# Patient Record
Sex: Female | Born: 1970 | Race: Black or African American | Hispanic: No | Marital: Married | Smoking: Never smoker
Health system: Southern US, Community
[De-identification: ages and names within clinical notes are randomized; demographics above are authoritative.]

## PROBLEM LIST (undated history)

## (undated) DIAGNOSIS — E785 Hyperlipidemia, unspecified: Secondary | ICD-10-CM

## (undated) DIAGNOSIS — I10 Essential (primary) hypertension: Secondary | ICD-10-CM

## (undated) DIAGNOSIS — E119 Type 2 diabetes mellitus without complications: Secondary | ICD-10-CM

## (undated) HISTORY — DX: Type 2 diabetes mellitus without complications: E11.9

## (undated) HISTORY — DX: Essential (primary) hypertension: I10

## (undated) HISTORY — DX: Hyperlipidemia, unspecified: E78.5

---

## 2002-08-08 ENCOUNTER — Emergency Department (HOSPITAL_COMMUNITY): Admission: EM | Admit: 2002-08-08 | Discharge: 2002-08-08 | Payer: Self-pay | Admitting: *Deleted

## 2006-03-20 ENCOUNTER — Emergency Department (HOSPITAL_COMMUNITY): Admission: EM | Admit: 2006-03-20 | Discharge: 2006-03-20 | Payer: Self-pay | Admitting: Emergency Medicine

## 2006-10-04 ENCOUNTER — Ambulatory Visit (HOSPITAL_COMMUNITY): Admission: RE | Admit: 2006-10-04 | Discharge: 2006-10-04 | Payer: Self-pay | Admitting: Obstetrics and Gynecology

## 2006-10-27 ENCOUNTER — Inpatient Hospital Stay (HOSPITAL_COMMUNITY): Admission: AD | Admit: 2006-10-27 | Discharge: 2006-10-28 | Payer: Self-pay | Admitting: Obstetrics and Gynecology

## 2006-10-27 ENCOUNTER — Ambulatory Visit: Payer: Self-pay | Admitting: Obstetrics and Gynecology

## 2006-10-28 ENCOUNTER — Ambulatory Visit: Payer: Self-pay | Admitting: Obstetrics & Gynecology

## 2006-10-28 ENCOUNTER — Inpatient Hospital Stay (HOSPITAL_COMMUNITY): Admission: AD | Admit: 2006-10-28 | Discharge: 2006-10-30 | Payer: Self-pay | Admitting: Family Medicine

## 2006-10-28 ENCOUNTER — Encounter: Payer: Self-pay | Admitting: Family Medicine

## 2006-11-05 ENCOUNTER — Inpatient Hospital Stay (HOSPITAL_COMMUNITY): Admission: EM | Admit: 2006-11-05 | Discharge: 2006-11-14 | Payer: Self-pay | Admitting: *Deleted

## 2008-09-08 ENCOUNTER — Ambulatory Visit: Payer: Self-pay | Admitting: Family Medicine

## 2008-09-08 DIAGNOSIS — E119 Type 2 diabetes mellitus without complications: Secondary | ICD-10-CM | POA: Insufficient documentation

## 2008-09-08 DIAGNOSIS — E669 Obesity, unspecified: Secondary | ICD-10-CM

## 2008-09-08 DIAGNOSIS — I10 Essential (primary) hypertension: Secondary | ICD-10-CM | POA: Insufficient documentation

## 2008-09-08 LAB — CONVERTED CEMR LAB: Glucose, Bld: 171 mg/dL

## 2008-09-18 ENCOUNTER — Encounter: Payer: Self-pay | Admitting: Family Medicine

## 2008-09-18 LAB — CONVERTED CEMR LAB
BUN: 9 mg/dL (ref 6–23)
Calcium: 8.9 mg/dL (ref 8.4–10.5)
Creatinine, Ser: 0.64 mg/dL (ref 0.40–1.20)
Glucose, Bld: 103 mg/dL — ABNORMAL HIGH (ref 70–99)
Monocytes Relative: 6 % (ref 3–12)
Neutro Abs: 3.8 10*3/uL (ref 1.7–7.7)
Platelets: 306 10*3/uL (ref 150–400)
RDW: 13.2 % (ref 11.5–15.5)
Sodium: 139 meq/L (ref 135–145)
TSH: 1.792 microintl units/mL (ref 0.350–4.500)
Total CHOL/HDL Ratio: 3.3
WBC: 7.3 10*3/uL (ref 4.0–10.5)

## 2008-12-09 IMAGING — CT CT ABDOMEN W/ CM
1 of 5 series · 10 of 32 positions shown, 16 images · IV contrast (Omnipaque 300)
Comparison: None

ABDOMEN CT WITH CONTRAST

CLINICAL DATA: Nausea and vomiting, status post C-section.
TECHNIQUE: Multidetector CT imaging of the abdomen and pelvis was performed
following the standard protocol during bolus administration of intravenous
contrast.

Contrast:  100 cc Omnipaque 300

[Series 2: abd_pel 5.0 b40f · axial · 0.72mm/px · z∈[-407,-22]mm · 10 of 95 slices shown, 16 images]
[im 9/95  soft-tissue]
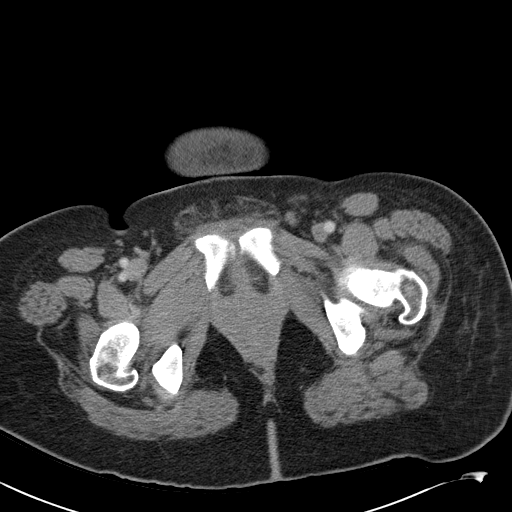
[im 9/95  bone]
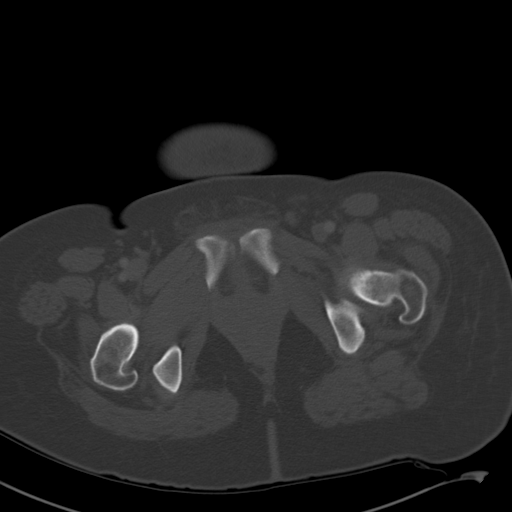
[im 18/95  soft-tissue]
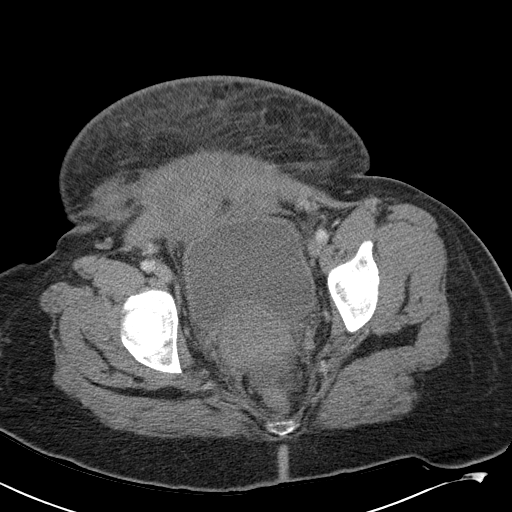
[im 26/95  soft-tissue]
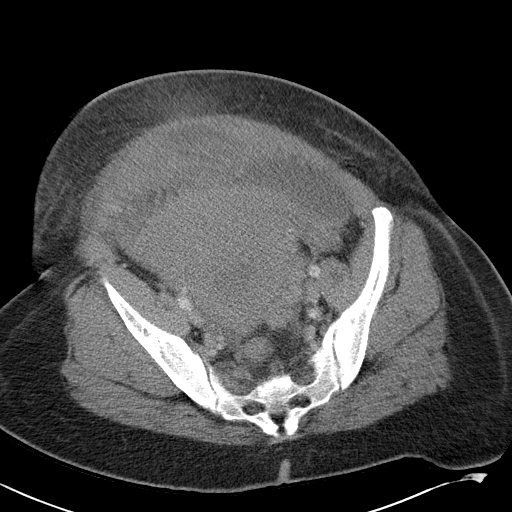
[im 35/95  soft-tissue]
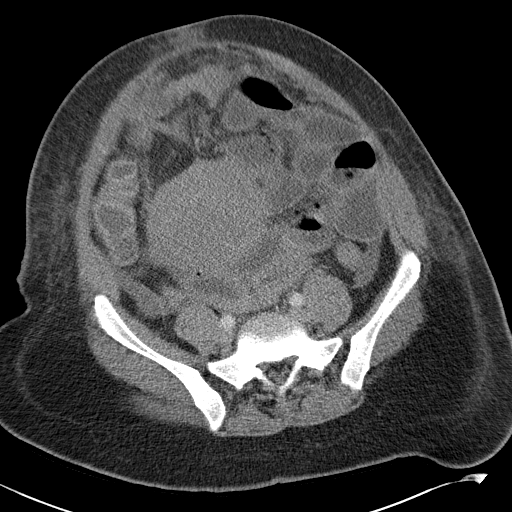
[im 43/95  soft-tissue]
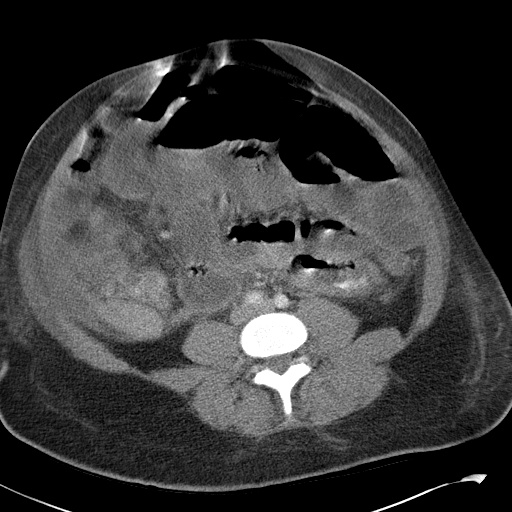
[im 52/95  soft-tissue]
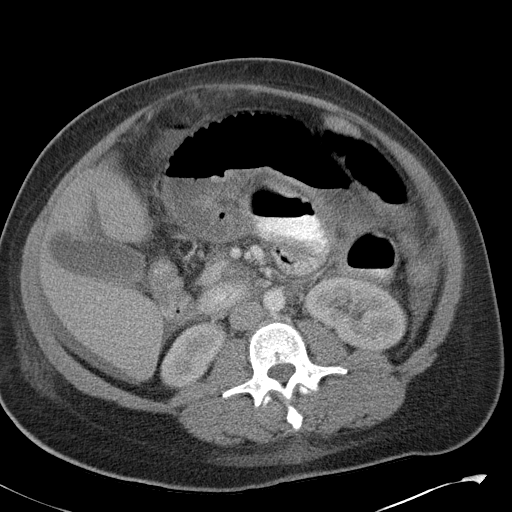
[im 60/95  soft-tissue]
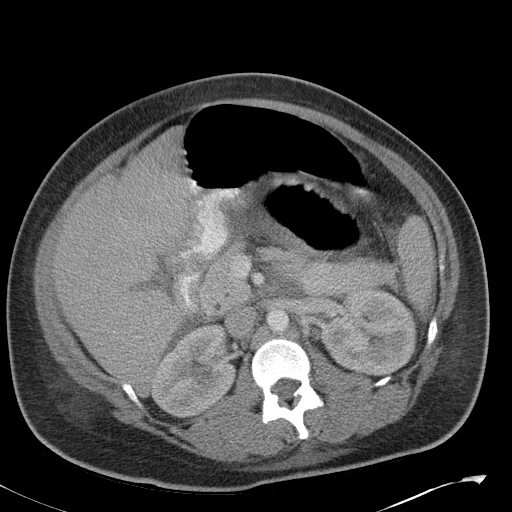
[im 60/95  lung]
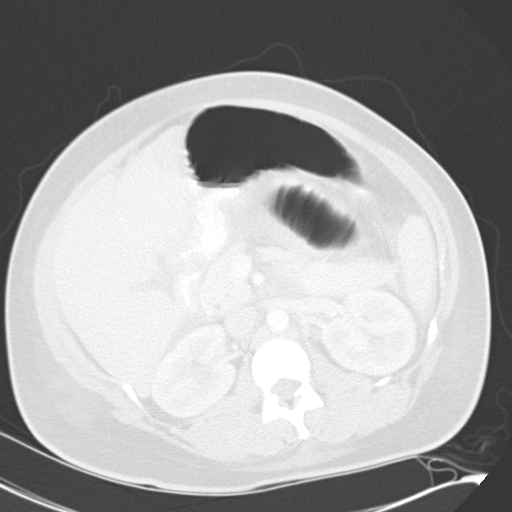
[im 69/95  soft-tissue]
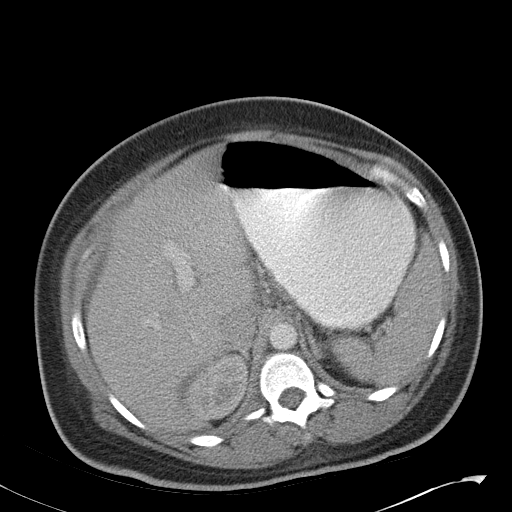
[im 69/95  lung]
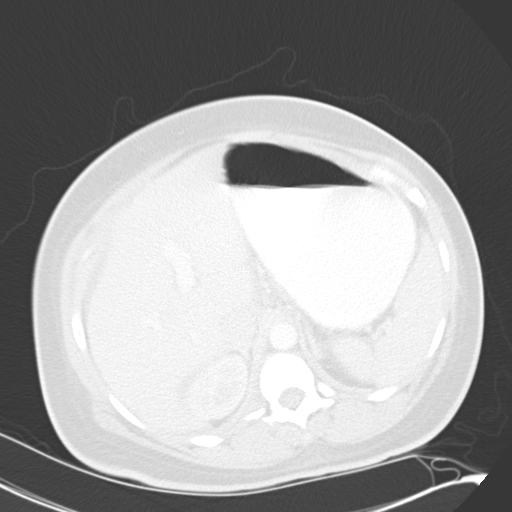
[im 77/95  soft-tissue]
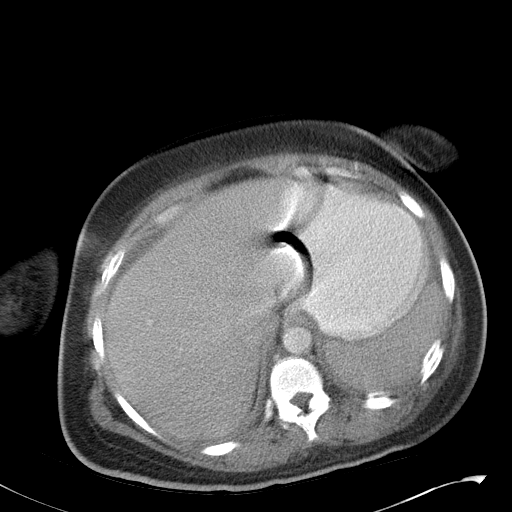
[im 77/95  lung]
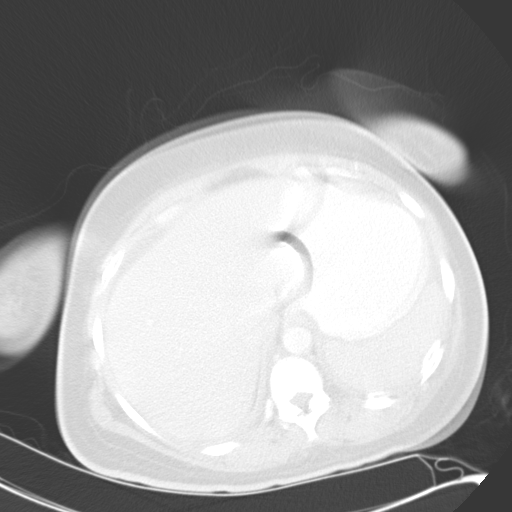
[im 77/95  bone]
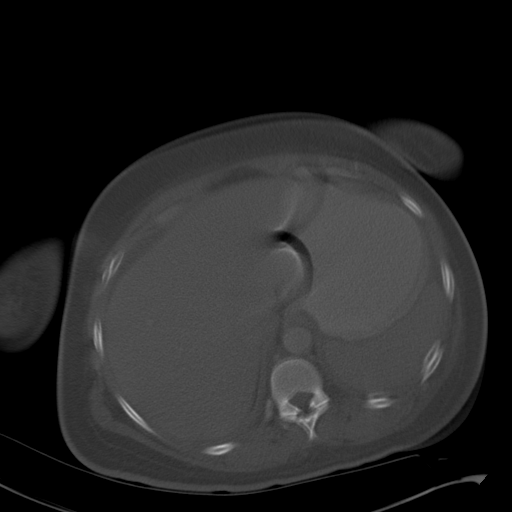
[im 86/95  soft-tissue]
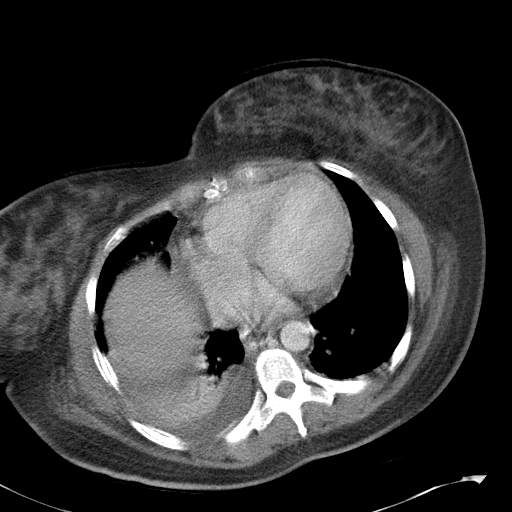
[im 86/95  lung]
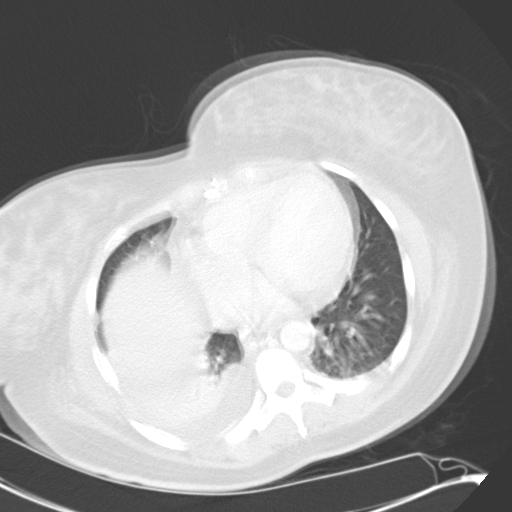

[10 of 32 positions shown; findings below may reference images not displayed]

FINDINGS: Motion artifact limits evaluation of the lower abdomen.
Right pleural effusion, moderate.
Right lower lobe collapse/consolidation.
Small volume ascites.
Dilated loops of small bowel more prominent proximally than distally, without
focal transition point.
Patulous gastroesophageal junction.
Decompressed colon.
Bilateral tubal ligation.
Renal delay is normal.
No obstruction of kidneys or ureters.

Motion artifact limits evaluation of small bowel, and some loops of dilated
small bowel proceed up to the anterior abdominal wall and C-section incision. It
is difficult to evaluate these areas due to the motion artifact, and entrapment
of small bowel within incision enclosure, or bowel injury in this region cannot
be excluded.
In the postsurgical setting, the findings are worrisome for obstruction or bowel
injury or related to recent procedure.

IMPRESSION
Right pleural effusion and right lower lobe collapse/consolidation.
Ascites.
Dilated loops of proximal small bowel, worrisome for small bowel obstruction. No
definite cause or transition is identified.  

Dr. Billiot discuss the findings with Dr. Pxpi @ 0714 by telephone.

PELVIS CT WITH CONTRAST
FINDINGS: Enlarged edematous uterus consistent with recent C-section delivery.
Bilateral tubal ligation.
Urinary bladder unremarkable.
Ascites.
Vasculature within normal limits.
Fluid apposed to anterior abdominal wall, admixed with small bowel.
Motion artifact limits evaluation.
Gas in subcutaneous tissues, probably relating to anticoagulation injection.
Air adjacent to C-section incision closure.
There is concern that this air may be contained within a loop of small bowel
which is closely approximated to the closure.
No definite evidence of pelvic abscess at this time but a large amount of fluid
is present in the anterior pelvis.

IMPRESSION
Enlarged uterus status post C-section.
Large amount of anterior pelvic fluid closely apposed to incision.

## 2009-02-08 ENCOUNTER — Other Ambulatory Visit: Admission: RE | Admit: 2009-02-08 | Discharge: 2009-02-08 | Payer: Self-pay | Admitting: Family Medicine

## 2009-02-08 ENCOUNTER — Ambulatory Visit: Payer: Self-pay | Admitting: Family Medicine

## 2009-02-10 ENCOUNTER — Encounter: Payer: Self-pay | Admitting: Family Medicine

## 2009-02-10 LAB — CONVERTED CEMR LAB
Candida species: NEGATIVE
Chlamydia, DNA Probe: NEGATIVE
GC Probe Amp, Genital: NEGATIVE
Microalb Creat Ratio: 7 mg/g (ref 0.0–30.0)

## 2009-02-22 DIAGNOSIS — E785 Hyperlipidemia, unspecified: Secondary | ICD-10-CM | POA: Insufficient documentation

## 2009-03-12 ENCOUNTER — Encounter (INDEPENDENT_AMBULATORY_CARE_PROVIDER_SITE_OTHER): Payer: Self-pay | Admitting: *Deleted

## 2009-10-22 ENCOUNTER — Encounter: Payer: Self-pay | Admitting: Family Medicine

## 2010-03-27 LAB — CONVERTED CEMR LAB: Glucose, Bld: 116 mg/dL

## 2010-03-31 NOTE — Letter (Signed)
Summary: Letter  Letter   Imported By: Lind Guest 10/22/2009 13:50:55  _____________________________________________________________________  External Attachment:    Type:   Image     Comment:   External Document

## 2010-03-31 NOTE — Letter (Signed)
Summary: 1st Missed Appt.  [facility]  [address]   [place], [place] [address]   Phone: [phone]  Fax: [phone]    March 12, 2009  MRN: [id number]  Krystal Alexander [address] [place], [place]  [address]  Dear Ms. Krystal Alexander,  At [facility], we make every attempt to fit patients into our schedule by reserving several appointment slots for same-day appointments.  However, we cannot always make appointments for patients the same day they are calling.  At the end of the day, we look back at our schedule and find that because of last-minute cancellations and patients not showing up for their scheduled appointments, we have several appointment slots that are left open and could have been used by another person who really needed it.  In the past, you may have been one of the patients who could not get in when you needed to.  But recently, you were one of the patients with an appointment that you didn't show up for or canceled too late for [place] to fill it.  We choose not to charge no-show or last minute cancellation fees to our patients, like many other offices do.  We do not wish to institute that policy and hope we never have to.  However, we kindly request that you assist [place] by providing at least 24 hours' notice if you can't make your appointment.  If no-shows or late cancellations become habitual (i.e. Three or more in a one-year period), we may terminate the physician-patient relationship.    Thank you for your consideration and cooperation.   Altamease Oiler

## 2010-07-12 NOTE — Discharge Summary (Signed)
NAMEFAYNE, Krystal Alexander              ACCOUNT NO.:  [id number]   MEDICAL RECORD NO.:  [id number]          PATIENT TYPE:  INP   LOCATION:  A330                          FACILITY:  [facility]   PHYSICIAN:  Tilda Burrow, M.D. DATE OF BIRTH:  1970-09-02   DATE OF ADMISSION:  11/05/2006  DATE OF DISCHARGE:  09/17/2008LH                               DISCHARGE SUMMARY   ADMISSION DIAGNOSES:  1. Postoperative small bowel obstruction, secondary to sub-fascial      hematoma.  2. Insulin-dependent diabetes mellitus.   DISCHARGE DIAGNOSIS:  Bilateral pleural effusions, improved.   PROCEDURE:  September 8-September 12 nasogastric suction.  November 11, 2006, laparotomy with evacuation of sub-fascial hematoma  with release of small bowel adhesions and small bowel obstruction,  repair of small bowel superficial abrasion, J.V. Emelda Fear.   DISCHARGE MEDICATIONS:  1. NPH insulin 20 units q. a.m., 10 units q. p.m.  2. Regular insulin 10 units q. a.m., 10 units q. p.m.   FOLLOWUP:  Follow up in 5 days for incision check.   HOSPITAL SUMMARY:  This 40 year old female gravida 2, para 1 status post  cesarean section and tubal ligation, (Filshie clips) on October 28, 2006  after failure to progress at [facility], was admitted on November 05, 2006 complaining of partial small bowel obstruction based on the ER  evaluation.  See Dr. Forestine Chute admitting history.  The patient was admitted  with white count 9,400, hemoglobin 10, hematocrit 30.4, BUN 13,  creatinine 0.67.  Electrolytes:  Potassium 2.7 with the patient afebrile  with CT showing evidence of pleural effusion.  The patient was admitted  for monitoring of bowel status.   HOSPITAL COURSE:  The patient responded to nasogastric suction.  Hemoglobin, initially, remained stable at 10.4 and 10.1.  The patient  had slight increase in labored breathing but her O2 sats remained  greater than 92% on room air.  She developed increased tachypnea on  September 12 with slight decrease in hemoglobin to 9.3, hematocrit 28.1.  On November 09, 2006, she has made sufficient progress that a trial of  p.o. feeding was encouraged.  She had persistent nausea and had a sub-  optimal response.  On November 10, 2006, she was still on p.o. feeding.  The incision was noted to have developed a fluid fluctuance on the right  side of the incision, which was aspirated and showed bloody, non-  purulent fluid.  The incision was prepped and draped and then opened on  the floor, sufficiently identified that there was a hematoma that was  sub-fascial location had disrupted the fascial closure on the right half  of the incision and dissected into the subcutaneous spaces.  The patient  was, therefore, scheduled for laparotomy on November 11, 2006.  She  received 2 units of packed cells and preparation for surgery.  Laparotomy was performed through the old Pfannenstiel incision on  November 11, 2006 with the patient found to have sub-fascial hematoma  as expected with small bowel adherent to the posterior aspects of the  hematoma, in the midline between the rectus muscles.  This was evacuated  and bowel freed of the anterior abdominal adhesions.  A small area of  surface abrasion denuding of the small bowel was oversewn with 3-0 silk.  The patient had 2 Al Pimple drains placed in the sub-fascial as well  as subcutaneous spaces.  Postoperatively, the patient had low-grade  temperature to 101.4 the first postoperative day.  The oxygen  saturations remained good while on 2 L CO2.  She had gradual improvement  in overall condition with tolerance of regular diet by postoperative day  2, 2,000 calorie ADA diet.  Hemoglobin remained stable at 11, hematocrit  32.8, white count gradually decreased.  She was afebrile by 48 hours  postop.  Was kept 72 hours and discharged on postop day 3 with JP drains  removed after reaching 15 mL per day of drainage output on each  side.  The patient will be discharged home for followup in 5 days for incision  check with discharge meds including Repliva as well as the  aforementioned medications.      Tilda Burrow, M.D.  Electronically Signed     JVF/MEDQ  D:  11/14/2006  T:  11/14/2006  Job:  [id number]   cc:   [facility]

## 2010-07-12 NOTE — Group Therapy Note (Signed)
NAMEJERRIANN, Krystal Alexander              ACCOUNT NO.:  [id number]   MEDICAL RECORD NO.:  [id number]          PATIENT TYPE:  INP   LOCATION:  A330                          FACILITY:  [facility]   PHYSICIAN:  Tilda Burrow, M.D. DATE OF BIRTH:  1970/09/15   DATE OF PROCEDURE:  11/10/2006  DATE OF DISCHARGE:                                 PROGRESS NOTE   Krystal Alexander was admitted on November 05, 2006 through the emergency room as  per prior notes.  On today's visit, she remains with slow bowel  function.  Her current problem consists of postoperative ileus versus  partial small bowel obstruction with gradual improvement, diabetes  mellitus which is currently adequately controlled, and we have now  raised some concern over the right side of the incision, where there is  some nontender thickening, which is developing a fluctuance suggesting a  seroma or hematoma.  This afternoon, the area of the incision has been  aspirated under sterile technique, and reveals dark generous amounts of  bloody fluid, consistent with hematoma.  The abdomen is prepped and  draped, local anesthesia applied.  The lateral aspect of the incision  opened revealing a large liquid and clot-filled area consistent with  hematoma.  There is some necrotic tissue consistent with a piece of the  fascia, and the fascial suture is floating free in the area as we  irrigate it and clean it out.  Digital exploration shows that the fascia  is disrupted from the right corner all the way to the midline, and the  origin of the hematoma was the subfascial hematoma.  Given that the  fascia will need to be debrided, the hematoma more thoroughly evacuated,  we will place the patient NPO and explore the abdomen in the a.m.  through the old incision, irrigating, debriding the fascia as necessary.  It may be necessary to trim some of the lower abdominal skin away to  improve access to the incision for cleanliness, but there is no malodor  and no  suspicion of active infection.  She likely has had some  retrograde bleeding into the abdominal cavity, explaining the ileus.  Therefore, we will open the abdomen and irrigate the abdomen, and  inspect bowel.  Will post the case for 8 a.m. on this Sunday.      Tilda Burrow, M.D.  Electronically Signed     JVF/MEDQ  D:  11/10/2006  T:  11/11/2006  Job:  [id number]   cc:   Norton Blizzard, MD   [facility]

## 2010-07-12 NOTE — Op Note (Signed)
Krystal Alexander, Krystal Alexander              ACCOUNT NO.:  [id number]   MEDICAL RECORD NO.:  [id number]          PATIENT TYPE:  INP   LOCATION:  9133                          FACILITY:  [facility]   PHYSICIAN:  Johnella Moloney, MD        DATE OF BIRTH:  [birth year]   DATE OF PROCEDURE:  10/28/2006  DATE OF DISCHARGE:                               OPERATIVE REPORT   PREOPERATIVE DIAGNOSES:  1. Term pregnancy.  2. Class B diabetes.  3. Failure to progress.  4. Undesired fertility.   POSTOPERATIVE DIAGNOSES:  1. Term pregnancy.  2. Class B diabetes.  3. Failure to progress.  4. Undesired fertility.   PROCEDURE PERFORMED:  Primary low transverse Cesarean section and  bilateral tubal ligation using Filshie clips via Pfannenstiel incision.   SURGEON:  Johnella Moloney, MD.   ASSISTANTGuy Sandifer. Tomblin, MD.   ANESTHESIA:  Epidural.   FLUIDS REPLACED:  1500 mL Lactated Ringers.   ESTIMATED BLOOD LOSS:  750 mL.   COMPLICATIONS:  None.   SPECIMENS:  Placenta to pathology.   INDICATIONS FOR PROCEDURE:  The patient is a 40 year old multip at 39  weeks who presented in early labor. The patient progressed to 7 cm  dilatation at which point her contractions were noted to be spacing out;  Pitocin was initiated and IUPC was also placed at this point. With the  IUPC in place, the patient was noted to have adequate contractions over  a period of 2 hours, but demonstrated no further change and increasing  fetal caput. Given a failure to progress, the patient was counseled for  primary Cesarean section for failure to progress. The patient also  indicated desire for bilateral tubal ligation and she was consented for  this procedure.   PROCEDURE IN DETAIL:  The patient was taken to the operating room where  her epidural was dosed up to surgical level without difficulty. She was  then placed in the dorsal supine position with a leftward tilt and  prepped and draped in a sterile manner.  A Pfannenstiel incision  was  then made with a scalpel and carried through to the underlying layer of  fascia. The fascia was incised in the midline, and this incision was  extended bilaterally using a Mayo scissors. Kocher clamps were applied  to the superior aspect of the fascial incision, and the rectus muscles  were dissected both bluntly and sharply using Mayo scissors. A similar  process was carried out on the inferior aspect of the incision. The  rectus muscles were separated in the midline bluntly and the peritoneum  was entered bluntly. This incision was extended bilaterally in a blunt  fashion with good visualization of the bowel and bladder.   Attention was then turned to the uterus where a low transverse  hysterotomy was made with a scalpel and extended bluntly. The fetus was  then delivered atraumatically, the cord was clamped and cut, and the  fetus was handed over to the waiting pediatricians. Cord blood was  collected according to protocol. Uterine massage was then administered  and the placenta delivered intact;  notable for a 3-vessel cord. The  uterus was then exteriorized and cleared of all clot and debris. A small  extension was noted in the midline of the hysterotomy and this was  repaired using 0 Monocryl in a running locked fashion. The rest of the  hysterotomy was also repaired with 0 Vicryl in a running locked fashion.  A second layer using 0 Monocryl was used as a horizontal imbricating  layer. Overall, good hemostasis was noted.   Attention was then turned to the fallopian tubes where Filshie clips  were placed on the isthmic sections of bilateral tubes. Overall good  hemostasis was noted. The uterus was then returned to the abdomen and  the pelvis was copiously irrigated with normal saline. Hemostasis was  confirmed on all surfaces.  The fascia was then closed using 0 Vicryl in  a running fashion and the skin was closed with staples. 10 mL of half-  percent Marcaine solution was used  to inject subcutaneously for local  anesthesia.  The patient tolerated the procedure well. Sponge, lap,  needle and instrument counts were correct x2.  She was taken to the  recovery room in stable condition.      Johnella Moloney, MD  Electronically Signed     UD/MEDQ  D:  10/28/2006  T:  10/28/2006  Job:  [id number]

## 2010-07-12 NOTE — Op Note (Signed)
Krystal Alexander, Krystal Alexander              ACCOUNT NO.:  [id number]   MEDICAL RECORD NO.:  [id number]          PATIENT TYPE:  INP   LOCATION:  A330                          FACILITY:  [facility]   PHYSICIAN:  Tilda Burrow, M.D. DATE OF BIRTH:  1970-11-28   DATE OF PROCEDURE:  DATE OF DISCHARGE:                               OPERATIVE REPORT   PREOPERATIVE DIAGNOSES:  1. Subfascial hematoma.  2. Partial small bowel obstruction.  3. Fifteen days status post Cesarean section .   POSTOPERATIVE DIAGNOSES:  1. Subfascial hematoma.  2. Partial small bowel obstruction.  3. Fifteen days status post Cesarean section .  4. Post small bowel adhesions, small bowel abrasion, partial small      bowel obstruction released.   PROCEDURE:  Laparotomy, lysis of adhesions, evacuation of subfascial  hematoma.,  Also Oversewing of small Bowel Abrasion   SURGEON:  Tilda Burrow, MD.   ASSISTANTAmie Critchley, CST.   ANESTHESIA:  General.   COMPLICATIONS:  None.   ESTIMATED BLOOD LOSS:  One hundred mL.   FINDINGS:  Small bowel adhesions to back side of peritoneal opening,  partial small bowel obstruction with proximal dilation of bowel,  released, partial-thickness abrasion to mid small bowel, disruption of  right one-half of fascial closure by subfascial hematoma, hematoma  beneath full length of fascial closure.   DRAINS:  Suprapubic subfascial drain, left lower quadrant subcutaneous  drain, nasogastric tube, Foley catheter.   INDICATIONS FOR PROCEDURE:  This 40 year old diabetic female, 15 days  status post C section, hospitalized since 11/05/06, with partial SBO,  confirmed yesterday to have a subcutaneous hematoma that was a breakdown  of an apparent subfascial hematoma.   DETAILS OF PROCEDURE:  The patient was taken to the operating room,  prepped and draped for lower-abdominal surgery. The opening yesterday  was emptied out of the packing and the entire Pfannenstiel incision  opened. The left  one-half of the subcutaneous closure was rather snugly  adherent. The right side had a large hematoma space which extended  beneath the fascia. Digital exploration showed the hematoma to reach all  the way across the full extent of the fascial incision. The suture was  intact on the left one-half of the incision, but it was necessary to  open the entire fascial incision. There was thick, brawny, nonpurulent  induration in the area of the pyramidalis muscles which were adherent to  each other in the midline. Above that there was nonpurulent fibrinous  material between the rectus muscles which could be grossly removed and  at this time you could see fibrinous material attached to the anterior  surface of the small bowel between the rectus muscles. We were able to  identify the peritoneal cavity using blunt dissection and  using index  finger manipulation were able to free up most of the small bowel  adhesions to the anterior abdominal wall.   The omentum was minimally involved in the upper aspects of the  peritoneal opening, with most of the omentum mobile and free and  uninvolved. The mass of central small bowel was kinked on itself and  with adhesions to itself which were easily freed up. There was 1 small 2-  cm long by 1-cm wide area of abrasion, partial thickness, to the small  bowel which was inspected, confirmed as having good vascularity and  noted for later oversewing. The small bowel was run from the dilated  loops of proximal bowel through the area of adhesion with all adhesions  released, run all the way to the appendix which had a small bit of  periappendicitis but no induration of the appendix itself. The colon  itself was without adhesions.   The pelvis was inspected with some difficulty due to the limited access  to the incision. Prior to the manipulation of the bowel we were  technically compromised by the firm fibrous retraction that had occurred  around the subcutaneous  fatty tissue. Therefore a 20-cm long x 6-cm wide  x 6-cm deep roll of skin and fatty tissue was removed from the superior  aspects of the incision to allow improved access and visibility to the  anterior  portions of the incision. We then proceeded with extensive  irrigation of the abdomen removing old blood and bloody peritoneal fluid  from the abdominal cavity as well as space of Retzius. At the end of the  procedure the bowel was freely mobile along its full length of the small  bowel and the omentum was completely mobile and able to be pulled down  for future reapproximation.   The pelvis was copiously irrigated. The cul-de-sac was initially thinly  adherent but easily disrupted. Antibiotic-containing saline irrigation  of the abdominal cavity was done extensively. Some fluid was left in the  abdomen. The anterior peritoneum was clean closed using running 2-0  Monocryl with good closure of the full length of the peritoneal cavity.  The omentum had been brought down to lie between this opening to reduce  small bowel adhesion potential in the future. The rectus muscles were  naturally in reasonably close approximately due to their induration.   A flat JP drain was placed between the rectus muscles and allowed to  exit through the suprapubic area through a separate stab incision. The  fascia was then closed using running 0 Prolene, 2 portions of suture,  each one tied in the midline sewing from side to midline. The  subcutaneous tissues required some revision with transverse incisions 1  cm to 2 cm above the fascia to allow for improved mobility through the  fibrous tissue that had developed, particularly in the inferior aspects.  This allowed mobilization of the subcutaneous fatty tissue sufficiently  to reapproximate the skin edges well. A flat JP drain was left just  above the fascia and allowed to exit through the left side of the  incision. Staple closure of the skin completed the  procedure with  improved contouring of the lower  abdomen for monitoring of the incision. Sponge and needle counts were  correct. Blood, urine output during the case greater than 400 mL.   The patient went to the recovery room and will resume routine postop  orders including diabetic blood sugars.      Tilda Burrow, M.D.  Electronically Signed     JVF/MEDQ  D:  11/11/2006  T:  11/11/2006  Job:  [id number]   cc:   Norton Blizzard, MD

## 2010-07-12 NOTE — H&P (Signed)
Krystal Alexander, Krystal Alexander              ACCOUNT NO.:  [id number]   MEDICAL RECORD NO.:  [id number]          PATIENT TYPE:  INP   LOCATION:  A330                          FACILITY:  [facility]   PHYSICIAN:  Lazaro Arms, M.D.   DATE OF BIRTH:  03-18-1970   DATE OF ADMISSION:  11/05/2006  DATE OF DISCHARGE:  LH                              HISTORY & PHYSICAL   Please refer to Dr. Rayna Sexton history and physical, operative report  and discharge summary from the hospitalization last week for her repeat  cesarean section.   INTERIM HISTORY AND PHYSICAL:  Krystal Alexander is 40 years old and status post C  section last week which was uncomplicated. She is an insulin dependent  diabetic and hypertensive and for the past 36 hours or so she has been  having nausea and vomiting and has progressed to having bilious emesis.  She came to the ER, had a CT scan performed which revealed no abscesses  or any other significant intraperitoneal process, normal postoperative  changes but a small bowel obstruction. As a result, she is admitted for  continuous NG tube drainage, electrolyte evaluation and management and  glucose control.   PHYSICAL EXAMINATION:  ABDOMEN:  She has a protuberant abdomen with  moderate to severe distention. Bowel sounds are absent. There are some  rushes and tinkles present in all 4 quadrants with the left lower  quadrant being greater than the others. She denies having had a bowel  movement in the last 48 hours, no diarrhea. She denies any fever.  PELVIC:  Exam is deferred.  EXTREMITIES:  Warm with 1 to 2+ edema.   IMPRESSION:  1. Postoperative small bowel obstruction.  2. Insulin dependent diabetes.   PLAN:  The patient is admitted for bowel decompression. I will do serial  x-rays to evaluate her change and also check her electrolytes daily to  make sure they are appropriate and also her magnesium levels being  appropriate. The patient understands indications for admission and   agrees.      Lazaro Arms, M.D.  Electronically Signed     LHE/MEDQ  D:  11/06/2006  T:  11/06/2006  Job:  [id number]

## 2010-07-12 NOTE — Discharge Summary (Signed)
Krystal Alexander, Krystal Alexander              ACCOUNT NO.:  [id number]   MEDICAL RECORD NO.:  [id number]          PATIENT TYPE:  INP   LOCATION:  9133                          FACILITY:  [facility]   PHYSICIAN:  Tilda Burrow, M.D. DATE OF BIRTH:  Oct 20, 1970   DATE OF ADMISSION:  10/28/2006  DATE OF DISCHARGE:  10/30/2006                               DISCHARGE SUMMARY   ADMITTING DIAGNOSES:  1. Pregnancy 38 weeks 5 days.  2. Active labor.  3. Diabetes mellitus, poorly controlled.  4. Chronic hypertension.  5. Borderline mental function.   DISCHARGE DIAGNOSES:  1. Pregnancy 38 weeks 5 days.  2. Active labor.  3. Diabetes mellitus, poorly controlled.  4. Chronic hypertension.  5. Borderline mental function.  6. Cephalopelvic disproportion.   PROCEDURE:  1. Primary low transverse cervical cesarean section.  2. Bilateral partial salpingectomy by Dr. Silas Flood on 10/28/2006.   DISCHARGE MEDICATIONS:  1. NPH 20 units subcutaneous every morning.  2. Regular insulin 10 units subcutaneous every morning.  The p.m. dose      of regular insulin 10 units in the afternoon, NPH 10 units at      bedtime.  3. Percocet 5/325 mg one p.o. every 4 hours p.r.n. pain.  4. Aldomet 500 mg p.o. b.i.d. for 30 days, refill times one.   FOLLOW UP:  In four days for incision check and blood sugar review.   HOSPITAL SUMMARY:  This 40 year old female followed through our office  for pregnancy care with poorly controlled diabetes mellitus with  challenging compliance.  Nonstress test have been reactive.  The patient  was admitted approximately 3 a.m. on 10/28/2003.  She progressed to 7 cm  edematous cervix at 8:35 a.m.  She had minimal progress over the next  three ours.  At 11:45 a decision was made to proceed with cesarean  section and tubal ligation plan.  She went with C-section at 12 p.m. due  to failure to progress.  The procedure was performed by Dr. Silas Flood with a  healthy female infant with Apgars of 8 and 9,  weight of 8 pounds 13  ounces delivered.  The patient was managed postpartum with insulin drip.  She was on Glucommander for 24 hours and then had a fasting blood sugar  of 125 on the first postpartum day.  She had a new regimen while she was  in the hospital of 25 units q.a.m. and 15 units q.a.m. with 10 units in  the evening and Humalog 10 mg in the evening as well.  Blood sugars were  in the 100-200 range.  Postpartum care was otherwise straightforward.  The following day she had an uneventful postop course with return to a  2000 calorie ADA diet.  She was discharged home for follow up for 4 days  for incision check.  Staples were left in place.  Follow up will be at  [facility] on Friday for review of blood sugars.  The patient will need  additional diabetes teaching as the patient is minimally compliant with  blood sugars due to apprehension of her medication management.   Also, patient  seen in office the afternoon of discharge to clear up  patient confusion over insulin dosing.      Tilda Burrow, M.D.  Electronically Signed     JVF/MEDQ  D:  10/30/2006  T:  10/30/2006  Job:  [id number]

## 2010-12-08 LAB — COMPREHENSIVE METABOLIC PANEL
ALT: 12
ALT: 12
AST: 12
AST: 13
AST: 14
Albumin: 1.9 — ABNORMAL LOW
BUN: 2 — ABNORMAL LOW
BUN: 4 — ABNORMAL LOW
CO2: 28
Calcium: 8.5
Chloride: 102
Chloride: 106
Creatinine, Ser: 0.5
Creatinine, Ser: 0.76
GFR calc Af Amer: >60
GFR calc Af Amer: >60
GFR calc non Af Amer: >60
GFR calc non Af Amer: >60
Glucose, Bld: 124 — ABNORMAL HIGH
Glucose, Bld: 141 — ABNORMAL HIGH
Sodium: 138
Total Bilirubin: 0.6
Total Bilirubin: 1
Total Bilirubin: 1.2
Total Protein: 5.5 — ABNORMAL LOW

## 2010-12-08 LAB — CBC
HCT: 32.8 — ABNORMAL LOW
HCT: 32.9 — ABNORMAL LOW
Hemoglobin: 10.6 — ABNORMAL LOW
MCHC: 33.1
MCHC: 33.2
MCHC: 33.5
MCV: 82.3
MCV: 82.8
Platelets: 867 — ABNORMAL HIGH
RDW: 14.5 — ABNORMAL HIGH
RDW: 14.8 — ABNORMAL HIGH
RDW: 15 — ABNORMAL HIGH
WBC: 12.7 — ABNORMAL HIGH
WBC: 14.3 — ABNORMAL HIGH
WBC: 15.5 — ABNORMAL HIGH

## 2010-12-08 LAB — DIFFERENTIAL
Basophils Absolute: 0
Basophils Relative: 0
Blasts: 0
Eosinophils Absolute: 0.2
Eosinophils Relative: 0
Lymphocytes Relative: 10 — ABNORMAL LOW
Lymphocytes Relative: 9 — ABNORMAL LOW
Lymphs Abs: 1.2
Metamyelocytes Relative: 0
Monocytes Absolute: 0.9 — ABNORMAL HIGH
Monocytes Absolute: 0.9 — ABNORMAL HIGH
Monocytes Relative: 6
Myelocytes: 0
Neutrophils Relative %: 85 — ABNORMAL HIGH
Promyelocytes Absolute: 0
nRBC: 0

## 2010-12-08 LAB — MAGNESIUM
Magnesium: 1.6
Magnesium: 1.6

## 2010-12-09 LAB — DIFFERENTIAL
Basophils Absolute: 0
Basophils Absolute: 0
Basophils Absolute: 0
Basophils Relative: 0
Basophils Relative: 0
Basophils Relative: 0
Basophils Relative: 0
Eosinophils Absolute: 0
Eosinophils Absolute: 0.1
Eosinophils Absolute: 0.1
Eosinophils Absolute: 0.1
Eosinophils Relative: 0
Eosinophils Relative: 0
Eosinophils Relative: 1
Eosinophils Relative: 1
Eosinophils Relative: 1
Lymphocytes Relative: 10 — ABNORMAL LOW
Lymphocytes Relative: 12
Lymphocytes Relative: 12
Lymphocytes Relative: 6 — ABNORMAL LOW
Lymphs Abs: 0.5 — ABNORMAL LOW
Lymphs Abs: 0.9
Lymphs Abs: 1.3
Lymphs Abs: 1.3
Monocytes Absolute: 0.2
Monocytes Absolute: 0.7
Monocytes Absolute: 0.8 — ABNORMAL HIGH
Monocytes Absolute: 0.8 — ABNORMAL HIGH
Monocytes Relative: 2 — ABNORMAL LOW
Monocytes Relative: 6
Monocytes Relative: 7
Neutro Abs: 8.3 — ABNORMAL HIGH
Neutro Abs: 9.2 — ABNORMAL HIGH
Neutro Abs: 9.3 — ABNORMAL HIGH
Neutrophils Relative %: 81 — ABNORMAL HIGH
Neutrophils Relative %: 88 — ABNORMAL HIGH
Neutrophils Relative %: 89 — ABNORMAL HIGH

## 2010-12-09 LAB — COMPREHENSIVE METABOLIC PANEL
ALT: 14
ALT: 16
ALT: 17
AST: 12
AST: 17
Albumin: 2 — ABNORMAL LOW
Albumin: 2.2 — ABNORMAL LOW
Albumin: 2.2 — ABNORMAL LOW
Alkaline Phosphatase: 106
Alkaline Phosphatase: 124 — ABNORMAL HIGH
Alkaline Phosphatase: 91
Alkaline Phosphatase: 93
Alkaline Phosphatase: 95
BUN: 4 — ABNORMAL LOW
BUN: 6
BUN: 7
CO2: 24
CO2: 28
Calcium: 7.2 — ABNORMAL LOW
Calcium: 8.6
Chloride: 101
Chloride: 106
Chloride: 107
Creatinine, Ser: 0.59
Creatinine, Ser: 0.66
GFR calc Af Amer: >60
GFR calc Af Amer: >60
GFR calc Af Amer: >60
GFR calc non Af Amer: >60
GFR calc non Af Amer: >60
Glucose, Bld: 100 — ABNORMAL HIGH
Glucose, Bld: 119 — ABNORMAL HIGH
Glucose, Bld: 97
Potassium: 3.3 — ABNORMAL LOW
Potassium: 3.4 — ABNORMAL LOW
Potassium: 3.8
Potassium: 3.9
Sodium: 136
Sodium: 137
Sodium: 140
Total Bilirubin: 1.2
Total Bilirubin: 1.3 — ABNORMAL HIGH
Total Bilirubin: 1.6 — ABNORMAL HIGH
Total Protein: 5.5 — ABNORMAL LOW
Total Protein: 5.7 — ABNORMAL LOW
Total Protein: 5.8 — ABNORMAL LOW
Total Protein: 6.5

## 2010-12-09 LAB — ANAEROBIC CULTURE

## 2010-12-09 LAB — CBC
HCT: 28.1 — ABNORMAL LOW
HCT: 30.4 — ABNORMAL LOW
HCT: 31.3 — ABNORMAL LOW
HCT: 31.5 — ABNORMAL LOW
HCT: 33.9 — ABNORMAL LOW
Hemoglobin: 10.1 — ABNORMAL LOW
Hemoglobin: 10.3 — ABNORMAL LOW
Hemoglobin: 10.4 — ABNORMAL LOW
Hemoglobin: 9.3 — ABNORMAL LOW
MCHC: 32.7
MCHC: 33.1
MCHC: 34.2
MCHC: 34.7
MCV: 81
MCV: 81.4
MCV: 82
MCV: 82.7
Platelets: 250
Platelets: 533 — ABNORMAL HIGH
Platelets: 686 — ABNORMAL HIGH
Platelets: 738 — ABNORMAL HIGH
Platelets: 801 — ABNORMAL HIGH
RBC: 3.73 — ABNORMAL LOW
RBC: 3.84 — ABNORMAL LOW
RBC: 3.86 — ABNORMAL LOW
RBC: 4.09
RBC: 4.22
RDW: 13.7
RDW: 14.4 — ABNORMAL HIGH
RDW: 14.5 — ABNORMAL HIGH
RDW: 14.9 — ABNORMAL HIGH
WBC: 11 — ABNORMAL HIGH
WBC: 11 — ABNORMAL HIGH
WBC: 11.8 — ABNORMAL HIGH
WBC: 9.4
WBC: 10.9 — ABNORMAL HIGH

## 2010-12-09 LAB — WOUND CULTURE

## 2010-12-09 LAB — URINALYSIS, ROUTINE W REFLEX MICROSCOPIC
Glucose, UA: NEGATIVE
Ketones, ur: NEGATIVE
Protein, ur: >300 — AB

## 2010-12-09 LAB — HEMOGLOBIN A1C
Hgb A1c MFr Bld: 5.8
Mean Plasma Glucose: 129

## 2010-12-09 LAB — POCT CARDIAC MARKERS
Myoglobin, poc: 89.8
Operator id: 207241
Troponin i, poc: 0.06 — ABNORMAL HIGH

## 2010-12-09 LAB — URINE MICROSCOPIC-ADD ON

## 2010-12-09 LAB — CROSSMATCH

## 2010-12-09 LAB — MAGNESIUM
Magnesium: 1.6
Magnesium: 1.7
Magnesium: 1.8
Magnesium: 1.9

## 2010-12-09 LAB — RAPID URINE DRUG SCREEN, HOSP PERFORMED
Barbiturates: NOT DETECTED
Cocaine: NOT DETECTED
Opiates: NOT DETECTED

## 2010-12-09 LAB — RPR: RPR Ser Ql: NONREACTIVE

## 2010-12-09 LAB — GLUCOSE, RANDOM: Glucose, Bld: 161 — ABNORMAL HIGH

## 2010-12-09 LAB — ABO/RH: ABO/RH(D): A POS

## 2022-05-16 ENCOUNTER — Other Ambulatory Visit: Payer: Self-pay

## 2022-05-16 ENCOUNTER — Ambulatory Visit (INDEPENDENT_AMBULATORY_CARE_PROVIDER_SITE_OTHER): Payer: 59 | Admitting: Family Medicine

## 2022-05-16 ENCOUNTER — Encounter (HOSPITAL_COMMUNITY): Payer: Self-pay

## 2022-05-16 ENCOUNTER — Encounter: Payer: Self-pay | Admitting: Family Medicine

## 2022-05-16 ENCOUNTER — Emergency Department (HOSPITAL_COMMUNITY)
Admission: EM | Admit: 2022-05-16 | Discharge: 2022-05-16 | Disposition: A | Discharge status: Home / Self Care | Payer: 59 | Attending: Emergency Medicine | Admitting: Emergency Medicine

## 2022-05-16 VITALS — BP 185/120 | HR 92 | Ht 64.0 in | Wt 180.0 lb

## 2022-05-16 DIAGNOSIS — Z114 Encounter for screening for human immunodeficiency virus [HIV]: Secondary | ICD-10-CM

## 2022-05-16 DIAGNOSIS — I10 Essential (primary) hypertension: Secondary | ICD-10-CM | POA: Diagnosis not present

## 2022-05-16 DIAGNOSIS — R7301 Impaired fasting glucose: Secondary | ICD-10-CM

## 2022-05-16 DIAGNOSIS — E785 Hyperlipidemia, unspecified: Secondary | ICD-10-CM | POA: Diagnosis not present

## 2022-05-16 DIAGNOSIS — E039 Hypothyroidism, unspecified: Secondary | ICD-10-CM | POA: Diagnosis not present

## 2022-05-16 DIAGNOSIS — Z1211 Encounter for screening for malignant neoplasm of colon: Secondary | ICD-10-CM

## 2022-05-16 DIAGNOSIS — I160 Hypertensive urgency: Secondary | ICD-10-CM | POA: Insufficient documentation

## 2022-05-16 DIAGNOSIS — Z1159 Encounter for screening for other viral diseases: Secondary | ICD-10-CM

## 2022-05-16 DIAGNOSIS — Z1231 Encounter for screening mammogram for malignant neoplasm of breast: Secondary | ICD-10-CM

## 2022-05-16 DIAGNOSIS — E119 Type 2 diabetes mellitus without complications: Secondary | ICD-10-CM

## 2022-05-16 DIAGNOSIS — Z0100 Encounter for examination of eyes and vision without abnormal findings: Secondary | ICD-10-CM

## 2022-05-16 LAB — URINALYSIS, ROUTINE W REFLEX MICROSCOPIC
Bilirubin Urine: NEGATIVE
Glucose, UA: >=500 mg/dL — AB
Ketones, ur: NEGATIVE mg/dL
Leukocytes,Ua: NEGATIVE
Nitrite: NEGATIVE
Protein, ur: >300 mg/dL — AB
Specific Gravity, Urine: >1.03 — ABNORMAL HIGH (ref 1.005–1.030)
pH: 6 (ref 5.0–8.0)

## 2022-05-16 LAB — CBC WITH DIFFERENTIAL/PLATELET
Abs Immature Granulocytes: 0.02 10*3/uL (ref 0.00–0.07)
Basophils Absolute: 0 10*3/uL (ref 0.0–0.1)
Basophils Relative: 0 %
Eosinophils Absolute: 0.1 10*3/uL (ref 0.0–0.5)
Eosinophils Relative: 1 %
HCT: 35 % — ABNORMAL LOW (ref 36.0–46.0)
Hemoglobin: 11.4 g/dL — ABNORMAL LOW (ref 12.0–15.0)
Immature Granulocytes: 0 %
Lymphocytes Relative: 28 %
Lymphs Abs: 1.8 10*3/uL (ref 0.7–4.0)
MCH: 25.6 pg — ABNORMAL LOW (ref 26.0–34.0)
MCHC: 32.6 g/dL (ref 30.0–36.0)
MCV: 78.5 fL — ABNORMAL LOW (ref 80.0–100.0)
Monocytes Absolute: 0.4 10*3/uL (ref 0.1–1.0)
Monocytes Relative: 6 %
Neutro Abs: 4.2 10*3/uL (ref 1.7–7.7)
Neutrophils Relative %: 65 %
Platelets: 331 10*3/uL (ref 150–400)
RBC: 4.46 MIL/uL (ref 3.87–5.11)
RDW: 12.6 % (ref 11.5–15.5)
WBC: 6.5 10*3/uL (ref 4.0–10.5)
nRBC: 0 % (ref 0.0–0.2)

## 2022-05-16 LAB — BASIC METABOLIC PANEL
Anion gap: 11 (ref 5–15)
BUN: 14 mg/dL (ref 6–20)
CO2: 26 mmol/L (ref 22–32)
Calcium: 9.1 mg/dL (ref 8.9–10.3)
Chloride: 97 mmol/L — ABNORMAL LOW (ref 98–111)
Creatinine, Ser: 0.68 mg/dL (ref 0.44–1.00)
GFR, Estimated: >60 mL/min (ref >60)
Glucose, Bld: 327 mg/dL — ABNORMAL HIGH (ref 70–99)
Potassium: 3.7 mmol/L (ref 3.5–5.1)
Sodium: 134 mmol/L — ABNORMAL LOW (ref 135–145)

## 2022-05-16 LAB — URINALYSIS, MICROSCOPIC (REFLEX): WBC, UA: >50 WBC/hpf (ref 0–5)

## 2022-05-16 LAB — PREGNANCY, URINE: Preg Test, Ur: NEGATIVE

## 2022-05-16 LAB — POC URINE PREG, ED: Preg Test, Ur: NEGATIVE

## 2022-05-16 LAB — TROPONIN I (HIGH SENSITIVITY): Troponin I (High Sensitivity): 15 ng/L (ref <18)

## 2022-05-16 MED ORDER — AMLODIPINE BESYLATE 2.5 MG PO TABS: 2.5000 mg | ORAL_TABLET | Freq: Every day | ORAL | 1 refills | Status: DC

## 2022-05-16 MED ORDER — OLMESARTAN MEDOXOMIL-HCTZ 40-12.5 MG PO TABS: 1.0000 | ORAL_TABLET | Freq: Every day | ORAL | 1 refills | Status: DC

## 2022-05-16 MED ORDER — LABETALOL HCL 5 MG/ML IV SOLN
10.0000 mg | Freq: Once | INTRAVENOUS | Status: AC
  Administered 2022-05-16: 10 mg via INTRAVENOUS
  Filled 2022-05-16: qty 4

## 2022-05-16 MED ORDER — HYDRALAZINE HCL 20 MG/ML IJ SOLN
10.0000 mg | Freq: Once | INTRAMUSCULAR | Status: AC
  Administered 2022-05-16: 10 mg via INTRAVENOUS
  Filled 2022-05-16: qty 1

## 2022-05-16 NOTE — Patient Instructions (Addendum)
It was pleasure meeting with you today. Please take medications as prescribed. Follow up with your primary health provider if any health concerns arises. Please visit the ED for your hypertension crisis ASAP

## 2022-05-16 NOTE — Assessment & Plan Note (Addendum)
Vitals:   05/16/22 1103 05/16/22 1111 05/16/22 1158  BP: (!) 203/115 (!) 198/110 (!) 185/120   Blood pressure not controlled- Advise patient to visit ED for hypertensive crisis, patient asymptomatic  Prescribed Olmesartan-Hctz 40-12.5 mg daily and amlodipine 2.5 mg  as initial therapy, labs ordered. Follow up in one week with at home blood pressure reading logs Explained non pharmacological interventions such as low salt, DASH diet discussed. Educated on stress reduction and physical activity. Discussed signs and symptoms of major cardiovascular event and need to present to the ED. Patient verbalizes understanding regarding plan of care and all questions answered.

## 2022-05-16 NOTE — Discharge Instructions (Addendum)
It is important to take your blood pressure medications every day as directed.  Avoid salt, drink water and exercise daily.  Please follow-up with your primary care provider in a few days to recheck your blood pressure.  Return to the emergency department for any new or worsening symptoms.

## 2022-05-16 NOTE — ED Triage Notes (Signed)
Per PCP note - "Blood pressure not controlled- Advise patient to visit ED for hypertensive crisis, patient asymptomatic."  Pt seen at PCP today for a regular visit, pt was hypertensive and advised to come to ED. Pt denies visual changes, headaches, no swelling.   BP 243/127 in triage, pt ambulatory. Denies pain. Pt is currently not taking any BP medications. NAD.

## 2022-05-16 NOTE — Progress Notes (Addendum)
New Patient Office Visit   Subjective   Patient ID: Krystal Alexander, female    DOB: 1970-04-07  Age: 52 y.o. MRN: [id number]  CC:  Chief Complaint  Patient presents with   Establish Care    HPI Krystal Alexander 52 year old female, presents to establish care. She  has a past medical history of Hypertension.  Patient here elevated blood pressure. She is not exercising and is not adherent to low salt diet. Blood pressure unknown if  controlled at home. Patient denies cardiac symptoms chest pain, chest pressure/discomfort, dyspnea, lower extremity edema, palpitations, syncope, and tachypnea. Patient denies  Cardiovascular risk factors: diabetes mellitus, family history of premature cardiovascular disease, hypertension, obesity (BMI >= 30 kg/m2), and sedentary lifestyle.     Outpatient Encounter Medications as of 05/16/2022  Medication Sig   amLODipine (NORVASC) 2.5 MG tablet Take 1 tablet (2.5 mg total) by mouth daily.   olmesartan-hydrochlorothiazide (BENICAR HCT) 40-12.5 MG tablet Take 1 tablet by mouth daily.   No facility-administered encounter medications on file as of 05/16/2022.    History reviewed. No pertinent surgical history.  Review of Systems  Constitutional:  Negative for chills and fever.  Respiratory:  Negative for shortness of breath.   Cardiovascular:  Negative for chest pain.  Gastrointestinal:  Negative for abdominal pain, nausea and vomiting.  Genitourinary:  Negative for dysuria.  Neurological:  Negative for dizziness and headaches.      Objective    BP (!) 185/120   Pulse 92   Ht 5\' 4"  (1.626 m)   Wt 180 lb (81.6 kg)   SpO2 97%   BMI 30.90 kg/m   Physical Exam Cardiovascular:     Rate and Rhythm: Normal rate.     Pulses: Normal pulses.  Pulmonary:     Effort: Pulmonary effort is normal.     Breath sounds: Normal breath sounds.  Musculoskeletal:        General: Normal range of motion.  Skin:    General: Skin is warm and dry.     Capillary  Refill: Capillary refill takes less than 2 seconds.  Neurological:     General: No focal deficit present.     Mental Status: She is alert.     Coordination: Coordination normal.     Gait: Gait normal.  Psychiatric:        Thought Content: Thought content normal.       Assessment & Plan:  Primary hypertension Assessment & Plan: Vitals:   05/16/22 1103 05/16/22 1111 05/16/22 1158  BP: (!) 203/115 (!) 198/110 (!) 185/120   Blood pressure not controlled- Advise patient to visit ED for hypertensive crisis, patient asymptomatic  Prescribed Olmesartan-Hctz 40-12.5 mg daily and amlodipine 2.5 mg  as initial therapy, labs ordered. Follow up in one week with at home blood pressure reading logs Explained non pharmacological interventions such as low salt, DASH diet discussed. Educated on stress reduction and physical activity. Discussed signs and symptoms of major cardiovascular event and need to present to the ED. Patient verbalizes understanding regarding plan of care and all questions answered.   Orders: -     CBC with Differential/Platelet -     CMP14+EGFR -     Microalbumin / creatinine urine ratio -     Olmesartan Medoxomil-HCTZ; Take 1 tablet by mouth daily.  Dispense: 30 tablet; Refill: 1 -     amLODIPine Besylate; Take 1 tablet (2.5 mg total) by mouth daily.  Dispense: 30 tablet; Refill: 1  IFG (impaired fasting glucose) -     Hemoglobin A1c  Hyperlipidemia, unspecified hyperlipidemia type -     Lipid panel  Hypothyroidism, unspecified type -     TSH + free T4  Screening for HIV (human immunodeficiency virus) -     HIV Antibody (routine testing w rflx)  Need for hepatitis C screening test -     Hepatitis C antibody  Encounter for screening mammogram for malignant neoplasm of breast -     Digital Screening Mammogram, Left and Right; Future  Screening for colon cancer -     Ambulatory referral to Gastroenterology  Encounter for eye exam in patient with type 2 diabetes  mellitus ([place]) -     Ambulatory referral to Ophthalmology  Essential hypertension Assessment & Plan: Vitals:   05/16/22 1103 05/16/22 1111 05/16/22 1158  BP: (!) 203/115 (!) 198/110 (!) 185/120   Blood pressure not controlled- Advise patient to visit ED for hypertensive crisis, patient asymptomatic  Prescribed Olmesartan-Hctz 40-12.5 mg daily and amlodipine 2.5 mg  as initial therapy, labs ordered. Follow up in one week with at home blood pressure reading logs Explained non pharmacological interventions such as low salt, DASH diet discussed. Educated on stress reduction and physical activity. Discussed signs and symptoms of major cardiovascular event and need to present to the ED. Patient verbalizes understanding regarding plan of care and all questions answered.      Return in about 1 week (around 05/23/2022) for re-check blood pressure, hypertension.   Renard Hamper Ria Comment, FNP

## 2022-05-16 NOTE — Addendum Note (Signed)
Addended by: Juanda Chance on: 05/16/2022 12:32 PM   Modules accepted: Orders

## 2022-05-16 NOTE — ED Provider Notes (Signed)
[place] Provider Note   CSN: [id number] Arrival date & time: 05/16/22  1255     History  Chief Complaint  Patient presents with   Hypertension    Krystal Alexander is a 52 y.o. female.   Hypertension Pertinent negatives include no chest pain, no headaches and no shortness of breath.       Krystal Alexander is a 52 y.o. female who presents to the Emergency Department complaining of uncontrolled hypertension.  Patient states she was sent to the ER under the recommendation of PCP for evaluation of high blood pressure.  She was at her PCPs office for establishment of primary care and was noted to be hypertensive.  Patient denies any symptoms at this time.  Initial blood pressure 243/127 in triage.  Patient states she has been on blood pressure medications in the past but has not taken any medication for her blood pressure in many months.    Home Medications Prior to Admission medications   Medication Sig Start Date End Date Taking? Authorizing Provider  amLODipine (NORVASC) 2.5 MG tablet Take 1 tablet (2.5 mg total) by mouth daily. 05/16/22   Del Eli Hose, FNP  olmesartan-hydrochlorothiazide (BENICAR HCT) 40-12.5 MG tablet Take 1 tablet by mouth daily. 05/16/22   Del Eli Hose, FNP      Allergies    Benazepril hcl    Review of Systems   Review of Systems  Eyes:  Negative for pain and visual disturbance.  Respiratory:  Negative for shortness of breath.   Cardiovascular:  Negative for chest pain.  Gastrointestinal:  Negative for nausea and vomiting.  Musculoskeletal:  Negative for arthralgias, back pain and neck pain.  Neurological:  Negative for dizziness, syncope, speech difficulty, weakness, light-headedness, numbness and headaches.    Physical Exam Updated Vital Signs BP (!) 212/119   Pulse 98   Temp 98.2 F (36.8 C) (Oral)   Resp (!) 26   Ht 5\' 4"  (1.626 m)   Wt 81.6 kg   SpO2 99% Comment:  Simultaneous filing. User may not have seen previous data.  BMI 30.90 kg/m  Physical Exam Vitals and nursing note reviewed.  Constitutional:      General: She is not in acute distress.    Appearance: Normal appearance. She is not ill-appearing or toxic-appearing.  Eyes:     Extraocular Movements: Extraocular movements intact.     Conjunctiva/sclera: Conjunctivae normal.     Pupils: Pupils are equal, round, and reactive to light.  Cardiovascular:     Rate and Rhythm: Normal rate and regular rhythm.     Pulses: Normal pulses.  Pulmonary:     Effort: Pulmonary effort is normal. No respiratory distress.  Chest:     Chest wall: No tenderness.  Abdominal:     Palpations: Abdomen is soft.     Tenderness: There is no abdominal tenderness.  Musculoskeletal:     Right lower leg: No edema.     Left lower leg: No edema.  Skin:    General: Skin is warm.     Capillary Refill: Capillary refill takes less than 2 seconds.  Neurological:     General: No focal deficit present.     Mental Status: She is alert.     GCS: GCS eye subscore is 4. GCS verbal subscore is 5. GCS motor subscore is 6.     Sensory: Sensation is intact. No sensory deficit.     Motor: Motor function is  intact. No weakness.     Comments: CN II through XII intact.  Speech clear.  No unilateral weakness or facial droop.     ED Results / Procedures / Treatments   Labs (all labs ordered are listed, but only abnormal results are displayed) Labs Reviewed  CBC WITH DIFFERENTIAL/PLATELET - Abnormal; Notable for the following components:      Result Value   Hemoglobin 11.4 (*)    HCT 35.0 (*)    MCV 78.5 (*)    MCH 25.6 (*)    All other components within normal limits  BASIC METABOLIC PANEL - Abnormal; Notable for the following components:   Sodium 134 (*)    Chloride 97 (*)    Glucose, Bld 327 (*)    All other components within normal limits  URINALYSIS, ROUTINE W REFLEX MICROSCOPIC - Abnormal; Notable for the following  components:   APPearance HAZY (*)    Specific Gravity, Urine >1.030 (*)    Glucose, UA >=500 (*)    Hgb urine dipstick TRACE (*)    Protein, ur >300 (*)    All other components within normal limits  URINALYSIS, MICROSCOPIC (REFLEX) - Abnormal; Notable for the following components:   Bacteria, UA MANY (*)    All other components within normal limits  PREGNANCY, URINE  POC URINE PREG, ED  TROPONIN I (HIGH SENSITIVITY)    EKG EKG Interpretation  Date/Time:  Tuesday May 16 2022 14:39:19 EDT Ventricular Rate:  84 PR Interval:  154 QRS Duration: 87 QT Interval:  368 QTC Calculation: 435 R Axis:   55 Text Interpretation: Sinus rhythm LAE, consider biatrial enlargement Nonspecific T abnormalities, lateral leads Minimal ST elevation, anterior leads Confirmed by Garnette Gunner [phone]) on 05/16/2022 4:36:52 PM  Radiology No results found.  Procedures Procedures    Medications Ordered in ED Medications  labetalol (NORMODYNE) injection 10 mg (10 mg Intravenous Given 05/16/22 1537)  hydrALAZINE (APRESOLINE) injection 10 mg (10 mg Intravenous Given 05/16/22 1720)    ED Course/ Medical Decision Making/ A&P                             Medical Decision Making Patient sent here by PCP for evaluation of possible hypertensive crisis  Patient is asymptomatic at this time.  Has not taken her antihypertensive medications in some time.  Will check labs, EKG and addressed consent state.  Amount and/or Complexity of Data Reviewed Labs: ordered.    Details: Labs interpreted by me, no evidence of endorgan damage.  Urine pregnancy negative.  Troponin unremarkable Radiology: ordered. ECG/medicine tests: ordered.    Details: EKG shows sinus rhythm with biatrial enlargement nonspecific T abnormalities Discussion of management or test interpretation with external provider(s): Patient has received IV labetalol and hydralazine here.  No improvement of blood pressure after labetalol, but blood  pressure responded nicely to the hydralazine.  Recommended hospital admission for hypertensive urgency, patient declined stating that she prefers to go home and start her prescribed blood pressure medication.  On recheck, patient now has blood pressure of 182/97.  Remains asymptomatic.  On review of record, PCP sent prescriptions in for olmesartan and amlodipine to patient's pharmacy of choice.  Will have her start these medications.  Appears appropriate for discharge home, strict return precautions were also discussed.  Risk Prescription drug management.           Final Clinical Impression(s) / ED Diagnoses Final diagnoses:  Hypertensive urgency  Rx / DC Orders ED Discharge Orders     None         Kem Parkinson, PA-C 05/18/22 1504    Cristie Hem, MD 05/19/22 [phone]

## 2022-05-17 ENCOUNTER — Encounter: Payer: Self-pay | Admitting: *Deleted

## 2022-05-24 ENCOUNTER — Ambulatory Visit: Payer: 59 | Admitting: Family Medicine

## 2022-05-29 ENCOUNTER — Encounter: Payer: Self-pay | Admitting: Family Medicine

## 2022-05-29 ENCOUNTER — Ambulatory Visit (INDEPENDENT_AMBULATORY_CARE_PROVIDER_SITE_OTHER): Payer: 59 | Admitting: Family Medicine

## 2022-05-29 VITALS — BP 162/94 | HR 102 | Ht 64.0 in | Wt 172.0 lb

## 2022-05-29 DIAGNOSIS — I10 Essential (primary) hypertension: Secondary | ICD-10-CM

## 2022-05-29 MED ORDER — AMLODIPINE BESYLATE 10 MG PO TABS: 10.0000 mg | ORAL_TABLET | Freq: Every day | ORAL | 1 refills | Status: DC

## 2022-05-29 MED ORDER — OLMESARTAN MEDOXOMIL-HCTZ 40-25 MG PO TABS: 1.0000 | ORAL_TABLET | Freq: Every day | ORAL | 1 refills | Status: DC

## 2022-05-29 NOTE — Patient Instructions (Signed)
It was pleasure meeting with you today. Please take medications as prescribed. Follow up with your primary health provider if any health concerns arises. If symptoms worsen please contact your primary care provider and/or visit the emergency department.  

## 2022-05-29 NOTE — Progress Notes (Signed)
Patient Office Visit   Subjective   Patient ID: Krystal Alexander, female    DOB: 1970/04/15  Age: 52 y.o. MRN: [id number]  CC:  Chief Complaint  Patient presents with   Hypertension    Patient is here for HTN f/u. Has not been checking her BP at home. Takes meds in the morning.     HPI Krystal Alexander 52 year old female, presents to clinic for hypertension follow up. She  has a past medical history of Hypertension.  Hypertension The problem is unchanged. The problem is uncontrolled. Pertinent negatives include no blurred vision, chest pain, headaches, neck pain, palpitations or shortness of breath. Risk factors for coronary artery disease include obesity, stress, family history, dyslipidemia and sedentary lifestyle. Past treatments include calcium channel blockers, angiotensin blockers and diuretics. The current treatment provides mild improvement. Compliance problems include exercise and diet.       Outpatient Encounter Medications as of 05/29/2022  Medication Sig   amLODipine (NORVASC) 10 MG tablet Take 1 tablet (10 mg total) by mouth daily.   olmesartan-hydrochlorothiazide (BENICAR HCT) 40-25 MG tablet Take 1 tablet by mouth daily.   [DISCONTINUED] amLODipine (NORVASC) 2.5 MG tablet Take 1 tablet (2.5 mg total) by mouth daily.   [DISCONTINUED] olmesartan-hydrochlorothiazide (BENICAR HCT) 40-12.5 MG tablet Take 1 tablet by mouth daily.   No facility-administered encounter medications on file as of 05/29/2022.    No past surgical history on file.  Review of Systems  Constitutional:  Negative for chills and fever.  Eyes:  Negative for blurred vision and double vision.  Respiratory:  Negative for shortness of breath.   Cardiovascular:  Negative for chest pain, palpitations and leg swelling.  Musculoskeletal:  Negative for neck pain.  Neurological:  Negative for dizziness and headaches.      Objective    BP (!) 162/94   Pulse (!) 102   Ht 5\' 4"  (1.626 m)   Wt 172 lb (78  kg)   SpO2 95%   BMI 29.52 kg/m   Physical Exam HENT:     Head: Normocephalic.  Cardiovascular:     Rate and Rhythm: Normal rate and regular rhythm.     Pulses: Normal pulses.     Heart sounds: Normal heart sounds.  Pulmonary:     Effort: Pulmonary effort is normal.     Breath sounds: Normal breath sounds.  Abdominal:     General: Bowel sounds are normal.     Palpations: Abdomen is soft.  Musculoskeletal:        General: Normal range of motion.  Skin:    General: Skin is warm and dry.     Capillary Refill: Capillary refill takes less than 2 seconds.  Neurological:     General: No focal deficit present.     Mental Status: She is alert.     Coordination: Coordination normal.     Gait: Gait normal.  Psychiatric:        Mood and Affect: Mood normal.       Assessment & Plan:  Primary hypertension Assessment & Plan: Vitals:   05/29/22 1040 05/29/22 1041  BP: (!) 185/106 (!) 162/94  Blood pressure not controlled. Increased Olmesartan-HCTZ 40-25 mg, Amlodipine 10 mg daily  Advise the importance of medication compliance, DASH diet discussed. Follow up in 4 week with blood pressure reading logs.  Orders: -     amLODIPine Besylate; Take 1 tablet (10 mg total) by mouth daily.  Dispense: 90 tablet; Refill: 1 -  Olmesartan Medoxomil-HCTZ; Take 1 tablet by mouth daily.  Dispense: 90 tablet; Refill: 1    No follow-ups on file.   Renard Hamper Ria Comment, FNP

## 2022-05-29 NOTE — Assessment & Plan Note (Signed)
Vitals:   05/29/22 1040 05/29/22 1041  BP: (!) 185/106 (!) 162/94  Blood pressure not controlled. Increased Olmesartan-HCTZ 40-25 mg, Amlodipine 10 mg daily  Advise the importance of medication compliance, DASH diet discussed. Follow up in 4 week with blood pressure reading logs.

## 2022-05-30 ENCOUNTER — Telehealth: Payer: Self-pay | Admitting: Family Medicine

## 2022-05-30 LAB — CBC WITH DIFFERENTIAL/PLATELET
EOS (ABSOLUTE): 0.1 10*3/uL (ref 0.0–0.4)
Hematocrit: 37.6 % (ref 34.0–46.6)
Immature Grans (Abs): 0 10*3/uL (ref 0.0–0.1)
Lymphs: 21 %
MCHC: 30.3 g/dL — ABNORMAL LOW (ref 31.5–35.7)
Neutrophils Absolute: 4.8 10*3/uL (ref 1.4–7.0)
Neutrophils: 70 %
RBC: 4.63 x10E6/uL (ref 3.77–5.28)
RDW: 12.7 % (ref 11.7–15.4)
WBC: 6.8 10*3/uL (ref 3.4–10.8)

## 2022-05-30 LAB — LIPID PANEL

## 2022-05-30 LAB — CMP14+EGFR

## 2022-05-30 LAB — HEMOGLOBIN A1C

## 2022-05-30 LAB — HIV ANTIBODY (ROUTINE TESTING W REFLEX)

## 2022-05-30 LAB — TSH+FREE T4: Free T4: 1.34 ng/dL (ref 0.82–1.77)

## 2022-05-30 NOTE — Telephone Encounter (Signed)
[employer] [facility] called the after line hours states has a critical lab report. Call back # [phone] choose option 1.

## 2022-05-30 NOTE — Telephone Encounter (Signed)
I will contact patient.

## 2022-05-31 ENCOUNTER — Other Ambulatory Visit: Payer: Self-pay | Admitting: Family Medicine

## 2022-05-31 DIAGNOSIS — E1169 Type 2 diabetes mellitus with other specified complication: Secondary | ICD-10-CM

## 2022-05-31 DIAGNOSIS — E1165 Type 2 diabetes mellitus with hyperglycemia: Secondary | ICD-10-CM

## 2022-05-31 LAB — HEMOGLOBIN A1C: Est. average glucose Bld gHb Est-mCnc: 352 mg/dL

## 2022-05-31 LAB — CMP14+EGFR
ALT: 11 IU/L (ref 0–32)
Albumin/Globulin Ratio: 1 — ABNORMAL LOW (ref 1.2–2.2)
Albumin: 4.3 g/dL (ref 3.9–4.9)
Alkaline Phosphatase: 142 IU/L — ABNORMAL HIGH (ref 44–121)
BUN/Creatinine Ratio: 26 — ABNORMAL HIGH (ref 9–23)
BUN: 25 mg/dL — ABNORMAL HIGH (ref 6–24)
Bilirubin Total: 0.3 mg/dL (ref 0.0–1.2)
Calcium: 9.8 mg/dL (ref 8.7–10.2)
Chloride: 93 mmol/L — ABNORMAL LOW (ref 96–106)
Globulin, Total: 4.1 g/dL (ref 1.5–4.5)
Glucose: 535 mg/dL (ref 70–99)
Total Protein: 8.4 g/dL (ref 6.0–8.5)

## 2022-05-31 LAB — CBC WITH DIFFERENTIAL/PLATELET
Basophils Absolute: 0 10*3/uL (ref 0.0–0.2)
Basos: 0 %
Eos: 1 %
Hemoglobin: 11.4 g/dL (ref 11.1–15.9)
Immature Granulocytes: 0 %
Lymphocytes Absolute: 1.4 10*3/uL (ref 0.7–3.1)
MCH: 24.6 pg — ABNORMAL LOW (ref 26.6–33.0)
MCV: 81 fL (ref 79–97)
Monocytes Absolute: 0.5 10*3/uL (ref 0.1–0.9)
Monocytes: 8 %
Platelets: 368 10*3/uL (ref 150–450)

## 2022-05-31 LAB — LIPID PANEL
Chol/HDL Ratio: 7 ratio — ABNORMAL HIGH (ref 0.0–4.4)
Triglycerides: 305 mg/dL — ABNORMAL HIGH (ref 0–149)

## 2022-05-31 LAB — TSH+FREE T4: TSH: 2.48 u[IU]/mL (ref 0.450–4.500)

## 2022-05-31 LAB — HEPATITIS C ANTIBODY: Hep C Virus Ab: NONREACTIVE

## 2022-05-31 MED ORDER — METFORMIN HCL 1000 MG PO TABS: 1000.0000 mg | ORAL_TABLET | Freq: Two times a day (BID) | ORAL | 3 refills | Status: DC

## 2022-05-31 MED ORDER — GLIMEPIRIDE 2 MG PO TABS: 2.0000 mg | ORAL_TABLET | Freq: Every day | ORAL | 3 refills | Status: DC

## 2022-05-31 MED ORDER — TIRZEPATIDE 2.5 MG/0.5ML ~~LOC~~ SOAJ: 2.5000 mg | SUBCUTANEOUS | 0 refills | Status: DC

## 2022-05-31 MED ORDER — FREESTYLE LANCETS MISC: 12 refills | Status: AC

## 2022-05-31 MED ORDER — LANCET DEVICE MISC: 1.0000 | Freq: Three times a day (TID) | 0 refills | Status: AC

## 2022-05-31 MED ORDER — TIRZEPATIDE 2.5 MG/0.5ML ~~LOC~~ SOAJ: 2.5000 mg | SUBCUTANEOUS | 1 refills | Status: DC

## 2022-05-31 MED ORDER — ATORVASTATIN CALCIUM 40 MG PO TABS: 40.0000 mg | ORAL_TABLET | Freq: Every day | ORAL | 3 refills | Status: DC

## 2022-05-31 MED ORDER — BLOOD GLUCOSE TEST VI STRP: 1.0000 | ORAL_STRIP | Freq: Three times a day (TID) | 3 refills | Status: AC

## 2022-05-31 MED ORDER — BLOOD GLUCOSE MONITORING SUPPL DEVI: 1.0000 | Freq: Three times a day (TID) | 0 refills | Status: DC

## 2022-05-31 MED ORDER — LANCETS MISC. MISC: 1.0000 | Freq: Three times a day (TID) | 0 refills | Status: AC

## 2022-05-31 NOTE — Progress Notes (Signed)
Please inform patient Hemoglobin A1c 13.9 and Cholesterol levels elevated. Needs to pick up medications sent to pharmacy ASAP. Atorvastatin 40 mg initial therapy for cholesterol management and I encourage over the counter omega-3 fish oil 1000 mg twice daily.  For type 2 diabetes: Started on Mounjaro 2.5 mg injection once weekly, Glimepiride 2.0 mg daily and Metformin 1000 mg twice daily. Referral place to endocrinology Please check your glucose with glucose kit meter kit: Goal blood sugars:  Fasting blood sugars: 80-130 Before lunch and dinner: less than 140 2 hours after meals: less than 180 Please follow up in 3 months for diabetes management with glucose logs  It is important to follow a DASH diet which includes vegetables,fruits,whole grains, fat free or low fat diary,fish,poultry,beans,nuts and seeds,vegetable oils. Find an activity that you will enjoyandstart to be active at least 5 days a week for 30 minutes each day.   Alkaline phosphate slightly elevated may indicate liver function or bone development issues, We will recheck your labs with additional labs to identity source of elevation in our next visit. I advise to consume vegetables like broccoli, onions, dandelion greens, cabbage, cauliflower and brussels sprouts which has a cleansing effect on the liver. You must also reduce your alcohol intake and limit smoking. Vitamin D deficiency has been linked with high alkaline phosphatase. To increase vitamin D get more sun and/or take vitamin D supplements  [phone] IU/day to prevent low vitamin D levels.

## 2022-06-01 ENCOUNTER — Telehealth: Payer: Self-pay | Admitting: Family Medicine

## 2022-06-01 NOTE — Progress Notes (Signed)
Hello Krystal Alexander were you able to reach this patient. I called and no answer

## 2022-06-01 NOTE — Telephone Encounter (Signed)
Patient called about lab results. 

## 2022-06-01 NOTE — Telephone Encounter (Signed)
Returned call, no answer.

## 2022-06-06 ENCOUNTER — Encounter: Payer: Self-pay | Admitting: "Endocrinology

## 2022-06-07 ENCOUNTER — Encounter: Payer: Self-pay | Admitting: "Endocrinology

## 2022-06-07 ENCOUNTER — Ambulatory Visit: Payer: 59 | Admitting: "Endocrinology

## 2022-06-26 ENCOUNTER — Ambulatory Visit (INDEPENDENT_AMBULATORY_CARE_PROVIDER_SITE_OTHER): Payer: 59 | Admitting: Family Medicine

## 2022-06-26 ENCOUNTER — Encounter: Payer: Self-pay | Admitting: Family Medicine

## 2022-06-26 VITALS — BP 145/96 | HR 92 | Ht 64.0 in | Wt 174.0 lb

## 2022-06-26 DIAGNOSIS — Z794 Long term (current) use of insulin: Secondary | ICD-10-CM | POA: Diagnosis not present

## 2022-06-26 DIAGNOSIS — I10 Essential (primary) hypertension: Secondary | ICD-10-CM

## 2022-06-26 DIAGNOSIS — E1165 Type 2 diabetes mellitus with hyperglycemia: Secondary | ICD-10-CM

## 2022-06-26 DIAGNOSIS — R748 Abnormal levels of other serum enzymes: Secondary | ICD-10-CM | POA: Diagnosis not present

## 2022-06-26 MED ORDER — VERAPAMIL HCL 40 MG PO TABS: 40.0000 mg | ORAL_TABLET | Freq: Every day | ORAL | 1 refills | Status: DC

## 2022-06-26 NOTE — Assessment & Plan Note (Signed)
Vitals:   06/26/22 1031 06/26/22 1100  BP: (!) 150/84 (!) 145/96  Blood pressure not controlled in today's visit, Labs ordered. Started verapamil 40 mg daily as initial dose, D/C amlodipine 10 mg daily Continue Omesartan-HCTZ 40-25 mg daily Follow up in 4 weeks Continued discussion on It is important to follow a DASH diet which includes vegetables,fruits,whole grains, fat free or low fat diary,fish,poultry,beans,nuts and seeds,vegetable oils. Find an activity that you will enjoy and start to be active at least 5 days a week for 30 minutes each day.

## 2022-06-26 NOTE — Progress Notes (Signed)
Patient Office Visit   Subjective   Patient ID: NEEVA TREW, female    DOB: Sep 23, 1970  Age: 52 y.o. MRN: [id number]  CC:  Chief Complaint  Patient presents with   Follow-up    Follow up    HPI BRITTNIE LEWEY 52 year old female, presents to the clinic for hypertension follow up. She  has a past medical history of Hyperlipidemia, Hypertension, and Type 2 diabetes mellitus (HCC).For the details of today's visit, please refer to assessment and plan.   HPI    Outpatient Encounter Medications as of 06/26/2022  Medication Sig   atorvastatin (LIPITOR) 40 MG tablet Take 1 tablet (40 mg total) by mouth daily.   Blood Glucose Monitoring Suppl DEVI 1 each by Does not apply route in the morning, at noon, and at bedtime. May substitute to any manufacturer covered by patient's insurance.   glimepiride (AMARYL) 2 MG tablet Take 1 tablet (2 mg total) by mouth daily before breakfast.   Glucose Blood (BLOOD GLUCOSE TEST STRIPS) STRP 1 each by In Vitro route in the morning, at noon, and at bedtime. May substitute to any manufacturer covered by patient's insurance.   Lancet Device MISC 1 each by Does not apply route in the morning, at noon, and at bedtime. May substitute to any manufacturer covered by patient's insurance.   Lancets (FREESTYLE) lancets Use as instructed   Lancets Misc. MISC 1 each by Does not apply route in the morning, at noon, and at bedtime. May substitute to any manufacturer covered by patient's insurance.   metFORMIN (GLUCOPHAGE) 1000 MG tablet Take 1 tablet (1,000 mg total) by mouth 2 (two) times daily with a meal.   olmesartan-hydrochlorothiazide (BENICAR HCT) 40-25 MG tablet Take 1 tablet by mouth daily.   tirzepatide [facility]) 2.5 MG/0.5ML Pen Inject 2.5 mg into the skin once a week.   verapamil (CALAN) 40 MG tablet Take 1 tablet (40 mg total) by mouth daily.   [DISCONTINUED] amLODipine (NORVASC) 10 MG tablet Take 1 tablet (10 mg total) by mouth daily.   No  facility-administered encounter medications on file as of 06/26/2022.    History reviewed. No pertinent surgical history.  Review of Systems  Constitutional:  Negative for chills and fever.  Eyes:  Negative for blurred vision.  Respiratory:  Negative for cough.   Cardiovascular:  Negative for chest pain.  Gastrointestinal:  Negative for heartburn.  Genitourinary:  Negative for dysuria.  Musculoskeletal:  Negative for joint pain.  Skin:  Negative for rash.  Neurological:  Negative for dizziness and headaches.  Psychiatric/Behavioral:  Negative for depression.       Objective    BP (!) 145/96   Pulse 92   Ht 5\' 4"  (1.626 m)   Wt 174 lb (78.9 kg)   SpO2 96%   BMI 29.87 kg/m   Physical Exam Vitals reviewed.  Constitutional:      General: She is not in acute distress.    Appearance: Normal appearance. She is not ill-appearing, toxic-appearing or diaphoretic.  HENT:     Head: Normocephalic.  Eyes:     General:        Right eye: No discharge.        Left eye: No discharge.     Conjunctiva/sclera: Conjunctivae normal.  Cardiovascular:     Rate and Rhythm: Normal rate.     Pulses: Normal pulses.     Heart sounds: Normal heart sounds.  Pulmonary:     Effort: Pulmonary effort is normal.  No respiratory distress.     Breath sounds: Normal breath sounds.  Abdominal:     General: Bowel sounds are normal.     Palpations: Abdomen is soft.  Musculoskeletal:        General: Normal range of motion.     Cervical back: Normal range of motion.  Skin:    General: Skin is warm and dry.     Capillary Refill: Capillary refill takes less than 2 seconds.  Neurological:     General: No focal deficit present.     Mental Status: She is alert and oriented to person, place, and time.     Coordination: Coordination normal.     Gait: Gait normal.  Psychiatric:        Mood and Affect: Mood normal.        Behavior: Behavior normal.        Thought Content: Thought content normal.        Assessment & Plan:  Primary hypertension Assessment & Plan: Vitals:   06/26/22 1031 06/26/22 1100  BP: (!) 150/84 (!) 145/96  Blood pressure not controlled in today's visit, Labs ordered. Started verapamil 40 mg daily as initial dose, D/C amlodipine 10 mg daily Continue Omesartan-HCTZ 40-25 mg daily Follow up in 4 weeks Continued discussion on It is important to follow a DASH diet which includes vegetables,fruits,whole grains, fat free or low fat diary,fish,poultry,beans,nuts and seeds,vegetable oils. Find an activity that you will enjoy and start to be active at least 5 days a week for 30 minutes each day.   Orders: -     Verapamil HCl; Take 1 tablet (40 mg total) by mouth daily.  Dispense: 90 tablet; Refill: 1 -     BASIC METABOLIC PANEL WITH GFR  Uncontrolled type 2 diabetes mellitus with hyperglycemia (HCC) -     Ambulatory referral to Endocrinology  Elevated alkaline phosphatase level -     Gamma GT -     BMP8+EGFR    Return in about 1 month (around 07/26/2022) for re-check blood pressure, hypertension.   Cruzita Lederer Newman Nip, FNP

## 2022-06-26 NOTE — Patient Instructions (Signed)

## 2022-06-27 LAB — BMP8+EGFR
BUN/Creatinine Ratio: 25 — ABNORMAL HIGH (ref 9–23)
BUN: 23 mg/dL (ref 6–24)
CO2: 22 mmol/L (ref 20–29)
Calcium: 9.5 mg/dL (ref 8.7–10.2)
Chloride: 97 mmol/L (ref 96–106)
Creatinine, Ser: 0.91 mg/dL (ref 0.57–1.00)
Glucose: 324 mg/dL — ABNORMAL HIGH (ref 70–99)
Potassium: 4.6 mmol/L (ref 3.5–5.2)
Sodium: 138 mmol/L (ref 134–144)
eGFR: 77 mL/min/{1.73_m2} (ref >59)

## 2022-06-27 LAB — GAMMA GT: GGT: 36 IU/L (ref 0–60)

## 2022-06-27 NOTE — Progress Notes (Signed)
Thank you recieved

## 2022-06-28 ENCOUNTER — Other Ambulatory Visit: Payer: Self-pay | Admitting: Family Medicine

## 2022-06-28 MED ORDER — INSULIN GLARGINE 100 UNIT/ML ~~LOC~~ SOLN: 10.0000 [IU] | Freq: Every evening | SUBCUTANEOUS | 0 refills | Status: DC

## 2022-06-28 NOTE — Progress Notes (Signed)
Started patient on insulin glargine (LANTUS) 100 UNIT/ML injection 10 units every evening. Referral placed to endocrinology for management of uncontrolled type 2 diabetes.

## 2022-07-26 ENCOUNTER — Ambulatory Visit: Payer: 59 | Admitting: Family Medicine

## 2022-09-29 ENCOUNTER — Other Ambulatory Visit: Payer: Self-pay | Admitting: Family Medicine

## 2022-09-29 DIAGNOSIS — E1165 Type 2 diabetes mellitus with hyperglycemia: Secondary | ICD-10-CM

## 2022-10-12 ENCOUNTER — Ambulatory Visit: Payer: 59 | Admitting: "Endocrinology

## 2022-11-20 ENCOUNTER — Encounter: Payer: Self-pay | Admitting: *Deleted

## 2022-12-21 NOTE — Patient Instructions (Signed)

## 2022-12-21 NOTE — Progress Notes (Signed)
Patient Office Visit   Subjective   Patient ID: Krystal Alexander, female    DOB: 1971/02/16  Age: 52 y.o. MRN: [id number]  CC:  Chief Complaint  Patient presents with   Follow-up    HtN    HPI Krystal Alexander 52 year old female, presents to the clinic chronic follow up  She  has a past medical history of Hyperlipidemia, Hypertension, and Type 2 diabetes mellitus (HCC).For the details of today's visit, please refer to assessment and plan.   HPI    Outpatient Encounter Medications as of 12/22/2022  Medication Sig   atorvastatin (LIPITOR) 40 MG tablet Take 1 tablet (40 mg total) by mouth daily.   Blood Pressure Monitoring (BLOOD PRESSURE KIT) DEVI Use as directed once daily   cloNIDine (CATAPRES) 0.2 MG tablet Take 1 tablet (0.2 mg total) by mouth every 12 (twelve) hours as needed. Take one tablet orally if blood pressure above 170/100   Continuous Glucose Sensor (DEXCOM G7 SENSOR) MISC Use as directed   Lancets (FREESTYLE) lancets Use as instructed   metFORMIN (GLUCOPHAGE) 1000 MG tablet Take 1 tablet (1,000 mg total) by mouth 2 (two) times daily with a meal.   metoprolol succinate (TOPROL-XL) 25 MG 24 hr tablet Take 1 tablet (25 mg total) by mouth daily.   [DISCONTINUED] insulin glargine (LANTUS) 100 UNIT/ML injection Inject 0.1 mLs (10 Units total) into the skin every evening.   [DISCONTINUED] olmesartan-hydrochlorothiazide (BENICAR HCT) 40-25 MG tablet Take 1 tablet by mouth daily.   [DISCONTINUED] verapamil (CALAN) 40 MG tablet Take 1 tablet (40 mg total) by mouth daily.   Blood Glucose Monitoring Suppl DEVI 1 each by Does not apply route in the morning, at noon, and at bedtime. May substitute to any manufacturer covered by patient's insurance. (Patient not taking: Reported on 12/22/2022)   glimepiride (AMARYL) 2 MG tablet Take 1 tablet (2 mg total) by mouth daily before breakfast.   insulin glargine (LANTUS) 100 UNIT/ML injection Inject 0.1 mLs (10 Units total) into the skin  every evening.   olmesartan-hydrochlorothiazide (BENICAR HCT) 40-25 MG tablet Take 1 tablet by mouth daily.   verapamil (CALAN) 40 MG tablet Take 1 tablet (40 mg total) by mouth daily.   [DISCONTINUED] glimepiride (AMARYL) 2 MG tablet TAKE 1 TABLET BY MOUTH ONCE DAILY BEFORE BREAKFAST (Patient not taking: Reported on 12/22/2022)   No facility-administered encounter medications on file as of 12/22/2022.    No past surgical history on file.  Review of Systems  Constitutional:  Negative for chills and fever.  Eyes:  Positive for blurred vision.  Respiratory:  Negative for shortness of breath.   Cardiovascular:  Negative for chest pain.  Gastrointestinal:  Negative for abdominal pain.  Genitourinary:  Negative for dysuria.  Skin:  Negative for rash.  Neurological:  Positive for headaches. Negative for dizziness.      Objective    BP (!) 208/111   Pulse 86   Ht 5\' 4"  (1.626 m)   Wt 186 lb 1.9 oz (84.4 kg)   LMP  (LMP Unknown)   SpO2 97%   BMI 31.95 kg/m   Physical Exam Vitals reviewed.  Constitutional:      General: She is not in acute distress.    Appearance: Normal appearance. She is not ill-appearing, toxic-appearing or diaphoretic.  HENT:     Head: Normocephalic.  Eyes:     General:        Right eye: No discharge.  Left eye: No discharge.     Conjunctiva/sclera: Conjunctivae normal.  Cardiovascular:     Rate and Rhythm: Tachycardia present.     Pulses: Normal pulses.  Pulmonary:     Effort: Pulmonary effort is normal. No respiratory distress.     Breath sounds: Normal breath sounds.  Abdominal:     General: Bowel sounds are normal.     Palpations: Abdomen is soft.     Tenderness: There is no abdominal tenderness. There is no right CVA tenderness, left CVA tenderness or guarding.  Musculoskeletal:        General: Normal range of motion.     Cervical back: Normal range of motion.  Skin:    General: Skin is warm and dry.     Capillary Refill: Capillary  refill takes less than 2 seconds.  Neurological:     General: No focal deficit present.     Mental Status: She is alert and oriented to person, place, and time.     Coordination: Coordination normal.     Gait: Gait normal.  Psychiatric:        Mood and Affect: Mood normal.        Behavior: Behavior normal.        Thought Content: Thought content normal.        Judgment: Judgment normal.       Assessment & Plan:  Primary hypertension Assessment & Plan: Vitals:   12/22/22 0848 12/22/22 0854 12/22/22 0937  BP: (!) 196/99 (!) 195/100 (!) 208/111  Hypertensive urgency- due to the elevated risk of cardiovascular complications, Advised patient to visit the Emergency Department. The patient acknowledges this recommendation and verbally agrees.  Medication Adherence: Compliance with the current regimen appears questionable. Current Medications: Patient reporking taking medications at 7:00 am: Olmesartan-HCTZ 40/25 mg and Verapamil 40 mg  Today Initiated Metoprolol 25 mg once daily Clonidine 0.2 mg PRN for blood pressure readings above 170/100 Action Plan: Visit ED Referral to Cardiology placed for further evaluation. Follow-up scheduled in two weeks, with instructions for the patient to provide home blood pressure readings to monitor trends.  Orders: -     Lipid panel -     BMP8+eGFR -     CBC with Differential/Platelet -     Blood Pressure Kit; Use as directed once daily  Dispense: 1 each; Refill: 1 -     Olmesartan Medoxomil-HCTZ; Take 1 tablet by mouth daily.  Dispense: 90 tablet; Refill: 1 -     Verapamil HCl; Take 1 tablet (40 mg total) by mouth daily.  Dispense: 90 tablet; Refill: 1  Type 2 diabetes mellitus with hyperglycemia, without long-term current use of insulin (HCC) -     Hemoglobin A1c -     Glimepiride; Take 1 tablet (2 mg total) by mouth daily before breakfast.  Dispense: 30 tablet; Refill: 0  Uncontrolled hypertension -     Ambulatory referral to  Cardiology  Other orders -     cloNIDine HCl; Take 1 tablet (0.2 mg total) by mouth every 12 (twelve) hours as needed. Take one tablet orally if blood pressure above 170/100  Dispense: 30 tablet; Refill: 2 -     Metoprolol Succinate ER; Take 1 tablet (25 mg total) by mouth daily.  Dispense: 90 tablet; Refill: 3 -     Insulin Glargine; Inject 0.1 mLs (10 Units total) into the skin every evening.  Dispense: 10 mL; Refill: 0 -     Dexcom G7 Sensor; Use as directed  Dispense: 1  each; Refill: 1    Return in about 2 weeks (around 01/05/2023), or if symptoms worsen or fail to improve, for re-check blood pressure.   Cruzita Lederer Newman Nip, FNP

## 2022-12-22 ENCOUNTER — Ambulatory Visit (INDEPENDENT_AMBULATORY_CARE_PROVIDER_SITE_OTHER): Payer: Medicaid Other | Admitting: Family Medicine

## 2022-12-22 ENCOUNTER — Encounter: Payer: Self-pay | Admitting: Family Medicine

## 2022-12-22 VITALS — BP 208/111 | HR 86 | Ht 64.0 in | Wt 186.1 lb

## 2022-12-22 DIAGNOSIS — I10 Essential (primary) hypertension: Secondary | ICD-10-CM | POA: Diagnosis not present

## 2022-12-22 DIAGNOSIS — Z794 Long term (current) use of insulin: Secondary | ICD-10-CM | POA: Diagnosis not present

## 2022-12-22 DIAGNOSIS — E1165 Type 2 diabetes mellitus with hyperglycemia: Secondary | ICD-10-CM | POA: Diagnosis not present

## 2022-12-22 DIAGNOSIS — Z7984 Long term (current) use of oral hypoglycemic drugs: Secondary | ICD-10-CM | POA: Diagnosis not present

## 2022-12-22 MED ORDER — BLOOD PRESSURE KIT DEVI: 1 refills | Status: DC

## 2022-12-22 MED ORDER — OLMESARTAN MEDOXOMIL-HCTZ 40-25 MG PO TABS: 1.0000 | ORAL_TABLET | Freq: Every day | ORAL | 1 refills | Status: DC

## 2022-12-22 MED ORDER — DEXCOM G7 SENSOR MISC: 1 refills | Status: DC

## 2022-12-22 MED ORDER — CLONIDINE HCL 0.2 MG PO TABS: 0.2000 mg | ORAL_TABLET | Freq: Two times a day (BID) | ORAL | 2 refills | Status: DC | PRN

## 2022-12-22 MED ORDER — GLIMEPIRIDE 2 MG PO TABS: 2.0000 mg | ORAL_TABLET | Freq: Every day | ORAL | 0 refills | Status: DC

## 2022-12-22 MED ORDER — METOPROLOL SUCCINATE ER 25 MG PO TB24: 25.0000 mg | ORAL_TABLET | Freq: Every day | ORAL | 3 refills | Status: DC

## 2022-12-22 MED ORDER — VERAPAMIL HCL 40 MG PO TABS: 40.0000 mg | ORAL_TABLET | Freq: Every day | ORAL | 1 refills | Status: DC

## 2022-12-22 MED ORDER — INSULIN GLARGINE 100 UNIT/ML ~~LOC~~ SOLN: 10.0000 [IU] | Freq: Every evening | SUBCUTANEOUS | 0 refills | Status: DC

## 2022-12-22 NOTE — Assessment & Plan Note (Addendum)
Vitals:   12/22/22 0848 12/22/22 0854 12/22/22 0937  BP: (!) 196/99 (!) 195/100 (!) 208/111  Hypertensive urgency- due to the elevated risk of cardiovascular complications, Advised patient to visit the Emergency Department. The patient acknowledges this recommendation and verbally agrees.  Medication Adherence: Compliance with the current regimen appears questionable. Current Medications: Patient reporking taking medications at 7:00 am: Olmesartan-HCTZ 40/25 mg and Verapamil 40 mg  Today Initiated Metoprolol 25 mg once daily Clonidine 0.2 mg PRN for blood pressure readings above 170/100 Action Plan: Visit ED Referral to Cardiology placed for further evaluation. Follow-up scheduled in two weeks, with instructions for the patient to provide home blood pressure readings to monitor trends.

## 2022-12-23 LAB — CBC WITH DIFFERENTIAL/PLATELET
Basophils Absolute: 0 10*3/uL (ref 0.0–0.2)
Basos: 0 %
EOS (ABSOLUTE): 0.1 10*3/uL (ref 0.0–0.4)
Eos: 1 %
Hematocrit: 31.6 % — ABNORMAL LOW (ref 34.0–46.6)
Hemoglobin: 9.9 g/dL — ABNORMAL LOW (ref 11.1–15.9)
Immature Grans (Abs): 0.1 10*3/uL (ref 0.0–0.1)
Immature Granulocytes: 1 %
Lymphocytes Absolute: 1.8 10*3/uL (ref 0.7–3.1)
Lymphs: 22 %
MCH: 25.6 pg — ABNORMAL LOW (ref 26.6–33.0)
MCHC: 31.3 g/dL — ABNORMAL LOW (ref 31.5–35.7)
MCV: 82 fL (ref 79–97)
Monocytes Absolute: 0.4 10*3/uL (ref 0.1–0.9)
Monocytes: 5 %
Neutrophils Absolute: 5.9 10*3/uL (ref 1.4–7.0)
Neutrophils: 71 %
Platelets: 379 10*3/uL (ref 150–450)
RBC: 3.86 x10E6/uL (ref 3.77–5.28)
RDW: 13.1 % (ref 11.7–15.4)
WBC: 8.2 10*3/uL (ref 3.4–10.8)

## 2022-12-23 LAB — LIPID PANEL
Chol/HDL Ratio: 4.4 ratio (ref 0.0–4.4)
Cholesterol, Total: 223 mg/dL — ABNORMAL HIGH (ref 100–199)
HDL: 51 mg/dL (ref >39)
LDL Chol Calc (NIH): 145 mg/dL — ABNORMAL HIGH (ref 0–99)
Triglycerides: 150 mg/dL — ABNORMAL HIGH (ref 0–149)
VLDL Cholesterol Cal: 27 mg/dL (ref 5–40)

## 2022-12-23 LAB — BMP8+EGFR
BUN/Creatinine Ratio: 15 (ref 9–23)
BUN: 11 mg/dL (ref 6–24)
CO2: 24 mmol/L (ref 20–29)
Calcium: 9.4 mg/dL (ref 8.7–10.2)
Chloride: 97 mmol/L (ref 96–106)
Creatinine, Ser: 0.73 mg/dL (ref 0.57–1.00)
Glucose: 286 mg/dL — ABNORMAL HIGH (ref 70–99)
Potassium: 4.3 mmol/L (ref 3.5–5.2)
Sodium: 136 mmol/L (ref 134–144)
eGFR: 100 mL/min/{1.73_m2} (ref >59)

## 2022-12-23 LAB — HEMOGLOBIN A1C
Est. average glucose Bld gHb Est-mCnc: 266 mg/dL
Hgb A1c MFr Bld: 10.9 % — ABNORMAL HIGH (ref 4.8–5.6)

## 2022-12-24 ENCOUNTER — Other Ambulatory Visit: Payer: Self-pay | Admitting: Family Medicine

## 2022-12-24 MED ORDER — GLIMEPIRIDE 4 MG PO TABS: 4.0000 mg | ORAL_TABLET | Freq: Every day | ORAL | 3 refills | Status: DC

## 2022-12-24 MED ORDER — OZEMPIC (0.25 OR 0.5 MG/DOSE) 2 MG/3ML ~~LOC~~ SOPN: 0.5000 mg | PEN_INJECTOR | SUBCUTANEOUS | 2 refills | Status: DC

## 2022-12-24 NOTE — Progress Notes (Signed)
Please inform patient: & Mail labs results  Randie Heinz work on reducing your Hemoglobin A1c levels! The target A1c goal is to keep it below 7%   Plan of treatment:  Metformin 1000 mg twice daily Glimepiride 4 mg before breakfast Continue at bedtime lantus injections Start Ozempic 0.5 mg once a week injections   Glucose Monitoring:  Fasting Blood Glucose: This is the blood sugar level checked after not eating for at least 8 hours, typically in the morning. The target range is usually 80-130   After Meals (Postprandial): Blood sugar should be checked about 1-2 hours after eating. The target range is typically less than 200  Bedtime Blood Glucose: Before bed, it's important to check glucose levels to ensure they are stable. A common goal is 90-180  Insulin Administration:  When to Administer Insulin: Follow your prescribed insulin regimen, which may involve giving insulin at bedtime.  Avoiding Hypoglycemia: If your blood glucose is below 120 mg/dL, you should avoid giving yourself an insulin injection  Administering insulin when blood glucose is too low can lead to hypoglycemia.  Symptoms of Hypoglycemia (low blood sugar):  Shakiness or trembling Sweating Dizziness or lightheadedness Confusion or difficulty concentrating Feeling anxious or irritable Fast heartbeat Hunger In severe cases, loss of consciousness or seizures  If you experience these symptoms, check your blood sugar immediately. If it is low, follow the 15-15 rule: eat 15 grams of fast-acting carbohydrates (such as glucose tablets, juice, or candy), wait 15 minutes, and then recheck your blood sugar. If it remains low, repeat the process and seek medical advice if necessary.  Please bring glucose log numbers next visit, Fasting, After lunch and Bedtime.        Cholesterol levels elevated,  Plan of treatment: Lipitor 40 mg once daily  Continue lifestyle modifications follow diet low in saturated fat, reduce  dietary salt intake, avoid fatty foods, maintain an exercise routine 3 to 5 days a week for a minimum total of 150 minutes.        CBC panel low, may indicate iron deficiency anemia. I advise to consume iron rich foods such as Red meat, pork, poultry, seafood, beans, dark green leafy vegetables, such as spinach, dried fruit, such as raisins and apricots. Iron-fortified cereals, and peas. I encourage to take over the counter iron tablets 65 mg once daily. In our next visit we'll draw recheck labs screen for iron deficiency anemia.  You can improve low MCH and MCV by increasing your dietary intake of foods high in iron, folate, and vitamins A and C.

## 2022-12-27 ENCOUNTER — Telehealth: Payer: Self-pay

## 2022-12-27 NOTE — Telephone Encounter (Signed)
YAY

## 2023-01-04 NOTE — Progress Notes (Addendum)
Established Patient Office Visit   Subjective  Patient ID: Krystal Alexander, female    DOB: [birth year]  Age: 52 y.o. MRN: [id number]  Chief Complaint  Patient presents with   Follow-up    HTN    She  has a past medical history of Hyperlipidemia, Hypertension, and Type 2 diabetes mellitus (HCC).  HPI  Patient presents to the clinic for Hypertension follow up. For the details of today's visit, please refer to assessment and plan.  Review of Systems  Constitutional:  Negative for chills and fever.  Eyes:  Positive for blurred vision.  Respiratory:  Negative for shortness of breath.   Cardiovascular:  Negative for chest pain and leg swelling.  Genitourinary:  Negative for dysuria.  Neurological:  Positive for headaches. Negative for dizziness.      Objective:     BP (!) 170/98   Pulse 85   Ht 5\' 4"  (1.626 m)   Wt 179 lb (81.2 kg)   LMP  (LMP Unknown)   SpO2 97%   BMI 30.73 kg/m  BP Readings from Last 3 Encounters:  01/05/23 (!) 170/98  12/22/22 (!) 208/111  06/26/22 (!) 145/96      Physical Exam Vitals reviewed.  Constitutional:      General: She is not in acute distress.    Appearance: Normal appearance. She is not ill-appearing, toxic-appearing or diaphoretic.  HENT:     Head: Normocephalic.  Eyes:     General:        Right eye: No discharge.        Left eye: No discharge.     Conjunctiva/sclera: Conjunctivae normal.  Cardiovascular:     Rate and Rhythm: Normal rate.     Pulses: Normal pulses.     Heart sounds: Normal heart sounds.  Pulmonary:     Effort: Pulmonary effort is normal. No respiratory distress.     Breath sounds: Normal breath sounds.  Abdominal:     General: Bowel sounds are normal.     Palpations: Abdomen is soft.     Tenderness: There is no abdominal tenderness. There is no right CVA tenderness, left CVA tenderness or guarding.  Musculoskeletal:        General: Normal range of motion.     Cervical back: Normal range of motion.   Skin:    General: Skin is warm and dry.     Capillary Refill: Capillary refill takes less than 2 seconds.  Neurological:     Mental Status: She is alert and oriented to person, place, and time.  Psychiatric:        Mood and Affect: Mood normal.      Results for orders placed or performed in visit on 01/05/23  EKG 12-Lead  Result Value Ref Range   [id number]      The 10-year ASCVD risk score (Arnett DK, et al., 2019) is: 28.4%    Assessment & Plan:  Iron deficiency anemia, unspecified iron deficiency anemia type -     Iron, TIBC and Ferritin Panel -     Vitamin B12  Vitamin D deficiency -     VITAMIN D 25 Hydroxy (Vit-D Deficiency, Fractures)  Resistant hypertension Assessment & Plan: Vitals:   01/05/23 1104 01/05/23 1115 01/05/23 1147  BP: (!) 203/113 (!) 202/113 (!) 170/98  Hypertensive urgency, EKG has been ordered. I strongly advised the patient to visit the emergency department for intravenous blood pressure management and emphasized the importance of follow-up with cardiology for today's appointment. Patient  reported not taking their prescribed blood pressure medication today.  I reinforced the critical importance of medication adherence, especially for hypertension, to reduce cardiovascular risk. While the patient agreed, adherence remains uncertain.  The current regimen includes Olmesartan-HCTZ 40/25 mg daily, Verapamil 40 mg daily Metoprolol 25 mg once daily and Clonidine 0.2 mg twice daily   The patient reports only taking Verapamil 40 mg daily, as it was the only medication dispensed by the pharmacy, despite all prescriptions being sent to pharmacy- Will call pharmacy to clarify.   Patient appears to have difficulty understanding the instructions, even after thorough explanation.      Orders: -     EKG 12-Lead    Return in about 4 months (around 05/05/2023), or if symptoms worsen or fail to improve, for chronic follow-up.   Cruzita Lederer Newman Nip,  FNP

## 2023-01-04 NOTE — Patient Instructions (Signed)

## 2023-01-05 ENCOUNTER — Ambulatory Visit: Payer: Medicaid Other | Admitting: Family Medicine

## 2023-01-05 ENCOUNTER — Encounter: Payer: Self-pay | Admitting: Internal Medicine

## 2023-01-05 ENCOUNTER — Ambulatory Visit: Payer: Medicaid Other | Attending: Internal Medicine | Admitting: Internal Medicine

## 2023-01-05 ENCOUNTER — Encounter: Payer: Self-pay | Admitting: Family Medicine

## 2023-01-05 VITALS — BP 170/98 | HR 85 | Ht 64.0 in | Wt 179.0 lb

## 2023-01-05 VITALS — BP 174/108 | HR 89 | Ht 64.0 in | Wt 179.0 lb

## 2023-01-05 DIAGNOSIS — D509 Iron deficiency anemia, unspecified: Secondary | ICD-10-CM

## 2023-01-05 DIAGNOSIS — I1A0 Resistant hypertension: Secondary | ICD-10-CM | POA: Diagnosis not present

## 2023-01-05 DIAGNOSIS — I10 Essential (primary) hypertension: Secondary | ICD-10-CM

## 2023-01-05 DIAGNOSIS — E559 Vitamin D deficiency, unspecified: Secondary | ICD-10-CM | POA: Diagnosis not present

## 2023-01-05 LAB — EKG 12-LEAD

## 2023-01-05 MED ORDER — OLMESARTAN-AMLODIPINE-HCTZ 40-10-25 MG PO TABS: 1.0000 | ORAL_TABLET | Freq: Every day | ORAL | 11 refills | Status: DC

## 2023-01-05 MED ORDER — BLOOD PRESSURE CUFF MISC: 1.0000 [IU] | Freq: Once | 0 refills | Status: AC

## 2023-01-05 NOTE — Patient Instructions (Signed)
Medication Instructions:  Your physician has recommended you make the following change in your medication:   -Stop Clonidine -Stop Metoprolol -Stop Olmesartan-HCTZ -Stop Verapamil -Start Olmesartan-Amlodipine-HCTZ 40-10-25 mg tablet once daily.  *If you need a refill on your cardiac medications before your next appointment, please call your pharmacy*   Lab Work: None If you have labs (blood work) drawn today and your tests are completely normal, you will receive your results only by: MyChart Message (if you have MyChart) OR A paper copy in the mail If you have any lab test that is abnormal or we need to change your treatment, we will call you to review the results.   Testing/Procedures: Your physician has requested that you have an echocardiogram. Echocardiography is a painless test that uses sound waves to create images of your heart. It provides your doctor with information about the size and shape of your heart and how well your heart's chambers and valves are working. This procedure takes approximately one hour. There are no restrictions for this procedure. Please do NOT wear cologne, perfume, aftershave, or lotions (deodorant is allowed). Please arrive 15 minutes prior to your appointment time.  Please note: We ask at that you not bring children with you during ultrasound (echo/ vascular) testing. Due to room size and safety concerns, children are not allowed in the ultrasound rooms during exams. Our front office staff cannot provide observation of children in our lobby area while testing is being conducted. An adult accompanying a patient to their appointment will only be allowed in the ultrasound room at the discretion of the ultrasound technician under special circumstances. We apologize for any inconvenience.  Your physician has requested that you have a renal artery duplex. During this test, an ultrasound is used to evaluate blood flow to the kidneys. Allow one hour for this  exam. Do not eat after midnight the day before and avoid carbonated beverages. Take your medications as you usually do.    Follow-Up: At [facility], you and your health needs are our priority.  As part of our continuing mission to provide you with exceptional heart care, we have created designated Provider Care Teams.  These Care Teams include your primary Cardiologist (physician) and Advanced Practice Providers (APPs -  Physician Assistants and Nurse Practitioners) who all work together to provide you with the care you need, when you need it.  We recommend signing up for the patient portal called "MyChart".  Sign up information is provided on this After Visit Summary.  MyChart is used to connect with patients for Virtual Visits (Telemedicine).  Patients are able to view lab/test results, encounter notes, upcoming appointments, etc.  Non-urgent messages can be sent to your provider as well.   To learn more about what you can do with MyChart, go to [email or web address].    Your next appointment:   1 month(s)  Provider:   You may see Luane School, MD or one of the following Advanced Practice Providers on your designated Care Team:   [place], PA-C  Jacolyn Reedy, [place]     Other Instructions

## 2023-01-05 NOTE — Progress Notes (Signed)
Cardiology Office Note  Date: 01/05/2023   ID: Krystal Alexander, DOB February 29, 1970, MRN [id number]  PCP:  Rica Records, FNP  Cardiologist:  Marjo Bicker, MD Electrophysiologist:  None   History of Present Illness: Krystal Alexander is a 52 y.o. female known to have HTN, HLD, DM2 on insulin was referred to cardiology clinic for management of HTN.  Currently taking only verapamil 40 mg once daily although she was on metoprolol, olmesartan-HCTZ and clonidine.  She is unclear why pharmacy did not give her the remaining medications.  I suspect if she has any compliance issues.  Denies having any symptoms of angina, DOE, orthopnea, PND, leg swelling, syncope, presyncope, dizziness.  Past Medical History:  Diagnosis Date   Hyperlipidemia    Hypertension    Type 2 diabetes mellitus (HCC)     History reviewed. No pertinent surgical history.  Current Outpatient Medications  Medication Sig Dispense Refill   atorvastatin (LIPITOR) 40 MG tablet Take 1 tablet (40 mg total) by mouth daily. 90 tablet 3   Blood Pressure Monitoring (BLOOD PRESSURE CUFF) MISC 1 Units by Does not apply route once for 1 dose. 1 each 0   Continuous Glucose Sensor (DEXCOM G7 SENSOR) MISC Use as directed 1 each 1   glimepiride (AMARYL) 4 MG tablet Take 1 tablet (4 mg total) by mouth daily before breakfast. 30 tablet 3   insulin glargine (LANTUS) 100 UNIT/ML injection Inject 0.1 mLs (10 Units total) into the skin every evening. 10 mL 0   Lancets (FREESTYLE) lancets Use as instructed 100 each 12   metFORMIN (GLUCOPHAGE) 1000 MG tablet Take 1 tablet (1,000 mg total) by mouth 2 (two) times daily with a meal. 180 tablet 3   Olmesartan-amLODIPine-HCTZ 40-10-25 MG TABS Take 1 tablet by mouth daily. 30 tablet 11   Semaglutide,0.25 or 0.5MG /DOS, (OZEMPIC, 0.25 OR 0.5 MG/DOSE,) 2 MG/3ML SOPN Inject 0.5 mg into the skin once a week. 3 mL 2   Blood Glucose Monitoring Suppl DEVI 1 each by Does not apply route in the  morning, at noon, and at bedtime. May substitute to any manufacturer covered by patient's insurance. (Patient not taking: Reported on 12/22/2022) 1 each 0   No current facility-administered medications for this visit.   Allergies:  Benazepril hcl   Social History: The patient  reports that she has never smoked. She has never used smokeless tobacco. She reports that she does not drink alcohol.   Family History: The patient's family history includes Diabetes in her maternal grandmother and paternal grandmother; Hypertension in her father and mother; Lung cancer in her maternal grandmother.   ROS:  Please see the history of present illness. Otherwise, complete review of systems is positive for none.  All other systems are reviewed and negative.   Physical Exam: VS:  BP (!) 174/108 (BP Location: Right Arm, Patient Position: Sitting, Cuff Size: Normal)   Pulse 89   Ht 5\' 4"  (1.626 m)   Wt 179 lb (81.2 kg)   LMP  (LMP Unknown)   SpO2 98%   BMI 30.73 kg/m , BMI Body mass index is 30.73 kg/m.  Wt Readings from Last 3 Encounters:  01/05/23 179 lb (81.2 kg)  01/05/23 179 lb (81.2 kg)  12/22/22 186 lb 1.9 oz (84.4 kg)    General: Patient appears comfortable at rest. HEENT: Conjunctiva and lids normal, oropharynx clear with moist mucosa. Neck: Supple, no elevated JVP or carotid bruits, no thyromegaly. Lungs: Clear to auscultation, nonlabored breathing at  rest. Cardiac: Regular rate and rhythm, no S3 or significant systolic murmur, no pericardial rub. Abdomen: Soft, nontender, no hepatomegaly, bowel sounds present, no guarding or rebound. Extremities: No pitting edema, distal pulses 2+. Skin: Warm and dry. Musculoskeletal: No kyphosis. Neuropsychiatric: Alert and oriented x3, affect grossly appropriate.  Recent Labwork: 05/29/2022: ALT 11; AST 9; TSH 2.480 12/22/2022: BUN 11; Creatinine, Ser 0.73; Hemoglobin 9.9; Platelets 379; Potassium 4.3; Sodium 136     Component Value Date/Time    CHOL 223 (H) 12/22/2022 0954   TRIG 150 (H) 12/22/2022 0954   HDL 51 12/22/2022 0954   CHOLHDL 4.4 12/22/2022 0954   CHOLHDL 3.3 Ratio 09/18/2008 2159   VLDL 14 09/18/2008 2159   LDLCALC 145 (H) 12/22/2022 0954     Assessment and Plan:   Hypertensive urgency HLD, not at goal IDDM 2  -Asymptomatic, compensated. BP running high at home, between 160 and 200 mmHg SBP.  Currently takes only verapamil 40 mg once daily and does not take remaining medications (olmesartan-HCTZ and metoprolol), she says she is not aware and asked if insurance will pay for the antihypertensive medications. Provided reassurance. Due to compliance issues, will prescribe a combination pill. Start olmesartan-amlodipine-HCTZ 40-10-25 mg once daily and discontinue current antihypertensive medications. Obtain 2D echocardiogram and ultrasound renal Doppler.  Instructed patient to check BP in a.m. and p.m. at home, does not have BP machine at home, will prescribe one today. Will follow-up with her through telephone visit in 1 month.  Instructed patient to reach out to the clinic if she has any difficulty obtaining medication from the pharmacy. -Currently on atorvastatin 40 mg nightly for HLD, goal LDL less than 100. -On insulin, metformin, glimepiride and Ozempic for DM2 management. Follows up with PCP.    I spent a total duration of 45 minutes reviewing the prior medications, labs, notes, EKG, face-to-face discussion/counseling of her medical condition, pathophysiology, evaluation, management, ordering tests/medications and documenting the findings in the note.   Medication Adjustments/Labs and Tests Ordered: Current medicines are reviewed at length with the patient today.  Concerns regarding medicines are outlined above.    Disposition:  Follow up 1 month, telephone visit  Signed, Emmery Seiler Verne Spurr, MD, 01/05/2023 4:46 PM    [facility] at [facility] [address], [place], [place]  [address]

## 2023-01-05 NOTE — Assessment & Plan Note (Addendum)
Vitals:   01/05/23 1104 01/05/23 1115 01/05/23 1147  BP: (!) 203/113 (!) 202/113 (!) 170/98  Hypertensive urgency, EKG has been ordered. I strongly advised the patient to visit the emergency department for intravenous blood pressure management and emphasized the importance of follow-up with cardiology for today's appointment. Patient reported not taking their prescribed blood pressure medication today.  I reinforced the critical importance of medication adherence, especially for hypertension, to reduce cardiovascular risk. While the patient agreed, adherence remains uncertain.  The current regimen includes Olmesartan-HCTZ 40/25 mg daily, Verapamil 40 mg daily Metoprolol 25 mg once daily and Clonidine 0.2 mg twice daily   The patient reports only taking Verapamil 40 mg daily, as it was the only medication dispensed by the pharmacy, despite all prescriptions being sent to pharmacy- Will call pharmacy to clarify.   Patient appears to have difficulty understanding the instructions, even after thorough explanation.

## 2023-01-11 ENCOUNTER — Telehealth: Payer: Self-pay | Admitting: Internal Medicine

## 2023-01-11 MED ORDER — OLMESARTAN-AMLODIPINE-HCTZ 40-10-25 MG PO TABS: 1.0000 | ORAL_TABLET | Freq: Every day | ORAL | 11 refills | Status: DC

## 2023-01-11 NOTE — Telephone Encounter (Signed)
Pt c/o medication issue:  1. Name of Medication:   Olmesartan-amLODIPine-HCTZ 40-10-25 MG TABS   2. How are you currently taking this medication (dosage and times per day)?   3. Are you having a reaction (difficulty breathing--STAT)?   4. What is your medication issue?    Patient stated the prescription was sent to [id number] - [place], [place] - [address] but they will not have the medication until next Tuesday.  Patient want prescription sent to [id number] - [place], [place] - [address] AT [facility] instead.

## 2023-01-11 NOTE — Telephone Encounter (Signed)
Refill complete 

## 2023-01-16 ENCOUNTER — Encounter (HOSPITAL_COMMUNITY): Payer: Self-pay

## 2023-01-16 ENCOUNTER — Ambulatory Visit (HOSPITAL_COMMUNITY): Admission: RE | Admit: 2023-01-16 | Payer: Medicaid Other | Source: Ambulatory Visit

## 2023-01-17 ENCOUNTER — Telehealth: Payer: Self-pay | Admitting: Internal Medicine

## 2023-01-17 ENCOUNTER — Ambulatory Visit (HOSPITAL_COMMUNITY): Admission: RE | Admit: 2023-01-17 | Payer: Medicaid Other | Source: Ambulatory Visit

## 2023-01-17 ENCOUNTER — Other Ambulatory Visit (HOSPITAL_COMMUNITY)
Admission: RE | Admit: 2023-01-17 | Discharge: 2023-01-17 | Disposition: A | Discharge status: Home / Self Care | Payer: Medicaid Other | Source: Ambulatory Visit | Attending: Family Medicine | Admitting: Family Medicine

## 2023-01-17 DIAGNOSIS — D509 Iron deficiency anemia, unspecified: Secondary | ICD-10-CM | POA: Insufficient documentation

## 2023-01-17 DIAGNOSIS — I10 Essential (primary) hypertension: Secondary | ICD-10-CM

## 2023-01-17 LAB — IRON AND TIBC
Iron: 39 ug/dL (ref 28–170)
Saturation Ratios: 10 % — ABNORMAL LOW (ref 10.4–31.8)
TIBC: 375 ug/dL (ref 250–450)
UIBC: 336 ug/dL

## 2023-01-17 LAB — VITAMIN D 25 HYDROXY (VIT D DEFICIENCY, FRACTURES): Vit D, 25-Hydroxy: 31.48 ng/mL (ref 30–100)

## 2023-01-17 LAB — FERRITIN: Ferritin: 18 ng/mL (ref 11–307)

## 2023-01-17 LAB — VITAMIN B12: Vitamin B-12: 710 pg/mL (ref 180–914)

## 2023-01-17 NOTE — Telephone Encounter (Signed)
Spoke to patient who stated she is unable to get transportation to [place] at this time.  Order placed for Renal Duplex at [facility] per provider's recommendation.

## 2023-01-17 NOTE — Telephone Encounter (Signed)
Patient walked in she had a renal ultrasound scheduled for today at 9 at [facility]. She thought it was here, she does not have transportation to travel to [place]. I cancelled appt for today. Please advise.

## 2023-01-17 NOTE — Progress Notes (Signed)
Please inform patient,  Iron saturation levels low, I advise to consume iron rich foods such as Red meat, pork, poultry, seafood, beans, dark green leafy vegetables, such as spinach, dried fruit, such as raisins and apricots. Iron-fortified cereals, and peas. I encourage to take over the counter iron tablets 65 mg once daily.   You can improve low MCH and MCV by increasing your dietary intake of foods high in iron, folate, and vitamins A and C.

## 2023-01-19 ENCOUNTER — Telehealth: Payer: Self-pay | Admitting: Internal Medicine

## 2023-01-19 NOTE — Telephone Encounter (Signed)
Checking percert on the following patient for   VAS US RENAL ARTERY DUPLEX    **Patient has requested to have this done at [facility], [place], [place].     Will need to check precert before they will schedule.

## 2023-01-22 ENCOUNTER — Ambulatory Visit (HOSPITAL_COMMUNITY): Admission: RE | Admit: 2023-01-22 | Payer: Medicaid Other | Source: Ambulatory Visit

## 2023-01-23 ENCOUNTER — Telehealth: Payer: Self-pay

## 2023-01-23 NOTE — Telephone Encounter (Signed)
Needs lab results

## 2023-01-24 NOTE — Telephone Encounter (Signed)
Pt has been informed.

## 2023-01-30 ENCOUNTER — Telehealth: Payer: Self-pay | Admitting: Internal Medicine

## 2023-01-30 NOTE — Telephone Encounter (Signed)
[facility] Scheduling Dept called and said they do not do  VAS US RENAL ARTERY DUPLEX there and will need to be done somewhere else.

## 2023-01-30 NOTE — Telephone Encounter (Signed)
Please advise. Pt does not have transportation to [place] to have testing completed.

## 2023-01-31 NOTE — Telephone Encounter (Signed)
Left a message for patient to call office back regarding doppler testing.

## 2023-02-01 NOTE — Telephone Encounter (Signed)
Left a message for patient to call office back regarding doppler testing.

## 2023-02-02 NOTE — Telephone Encounter (Signed)
Patient wants to wait for Renal. Pt does not want CT at this time. Pt stated she missed her Echo appt and would like to reschedule that. Will route message to schedulers.

## 2023-02-07 ENCOUNTER — Telehealth: Payer: Self-pay

## 2023-02-12 NOTE — Telephone Encounter (Signed)
Encounter made in error. 

## 2023-02-13 ENCOUNTER — Ambulatory Visit (HOSPITAL_COMMUNITY)
Admission: RE | Admit: 2023-02-13 | Discharge: 2023-02-13 | Disposition: A | Discharge status: Home / Self Care | Payer: Medicaid Other | Source: Ambulatory Visit | Attending: Internal Medicine | Admitting: Internal Medicine

## 2023-02-13 ENCOUNTER — Telehealth: Payer: Self-pay | Admitting: Family Medicine

## 2023-02-13 DIAGNOSIS — I10 Essential (primary) hypertension: Secondary | ICD-10-CM | POA: Diagnosis not present

## 2023-02-13 LAB — ECHOCARDIOGRAM COMPLETE
AR max vel: 1.77 cm2
AV Area VTI: 1.82 cm2
AV Area mean vel: 2.01 cm2
AV Mean grad: 6.3 mm[Hg]
AV Peak grad: 11.7 mm[Hg]
Ao pk vel: 1.71 m/s
Area-P 1/2: 2.91 cm2
S' Lateral: 2.35 cm

## 2023-02-13 NOTE — Telephone Encounter (Signed)
Received an updated approval notice from [employer] for Lantus Solostar.

## 2023-02-13 NOTE — Progress Notes (Signed)
*  PRELIMINARY RESULTS* Echocardiogram 2D Echocardiogram has been performed.  Krystal Alexander 02/13/2023, 12:20 PM

## 2023-02-16 NOTE — Telephone Encounter (Signed)
Encounter made in error. 

## 2023-02-20 ENCOUNTER — Telehealth: Payer: Self-pay | Admitting: Internal Medicine

## 2023-02-20 ENCOUNTER — Telehealth: Payer: Self-pay | Admitting: *Deleted

## 2023-02-20 ENCOUNTER — Encounter: Payer: Self-pay | Admitting: Internal Medicine

## 2023-02-20 ENCOUNTER — Ambulatory Visit: Payer: Medicaid Other | Attending: Internal Medicine | Admitting: Internal Medicine

## 2023-02-20 VITALS — Ht 64.0 in | Wt 174.0 lb

## 2023-02-20 DIAGNOSIS — Z01810 Encounter for preprocedural cardiovascular examination: Secondary | ICD-10-CM | POA: Diagnosis not present

## 2023-02-20 DIAGNOSIS — I701 Atherosclerosis of renal artery: Secondary | ICD-10-CM

## 2023-02-20 DIAGNOSIS — Z8249 Family history of ischemic heart disease and other diseases of the circulatory system: Secondary | ICD-10-CM

## 2023-02-20 DIAGNOSIS — I517 Cardiomegaly: Secondary | ICD-10-CM

## 2023-02-20 MED ORDER — BLOOD PRESSURE KIT: 1.0000 | PACK | Freq: Every day | 0 refills | Status: DC

## 2023-02-20 NOTE — Patient Instructions (Addendum)
Medication Instructions:  Your physician recommends that you continue on your current medications as directed. Please refer to the Current Medication list given to you today.   Labwork: CBC to be completed at [facility] in [place] 1-2 weeks before Cardiac MRI  Testing/Procedures:   You are scheduled for Cardiac MRI at the location below.  Please arrive for your appointment  ______________ . ?  [facility] [address] [place], [place] [address] [phone] Please take advantage of the free valet parking available at the [facility] and [employer] (Entrance C).  Proceed to the [facility] Radiology Department (First Floor) for check-in.   OR   [facility] [address] [place], [place] [address] [phone] Please go to the [facility] and check-in with the desk attendant.   Magnetic resonance imaging (MRI) is a painless test that produces images of the inside of the body without using Xrays.  During an MRI, strong magnets and radio waves work together in a Data processing manager to form detailed images.   MRI images may provide more details about a medical condition than X-rays, CT scans, and ultrasounds can provide.  You may be given earphones to listen for instructions.  You may eat a light breakfast and take medications as ordered with the exception of Olmesartan-amlodipine-hydrochlorothiazide, (or any other fluid pill). If you are undergoing a stress MRI, please avoid stimulants for 12 hr prior to test. (I.e. Caffeine, nicotine, chocolate, or antihistamine medications)  An IV will be inserted into one of your veins. Contrast material will be injected into your IV. It will leave your body through your urine within a day. You may be told to drink plenty of fluids to help flush the contrast material out of your system.  You will be asked to remove all metal, including: Watch, jewelry, and other metal objects including hearing aids,  hair pieces and dentures. Also wearable glucose monitoring systems (ie. Freestyle Libre and Omnipods) (Braces and fillings normally are not a problem.)   TEST WILL TAKE APPROXIMATELY 1 HOUR  PLEASE NOTIFY SCHEDULING AT LEAST 24 HOURS IN ADVANCE IF YOU ARE UNABLE TO KEEP YOUR APPOINTMENT. [phone]  For more information and frequently asked questions, please visit our website : [email or web address]  Please call the Cardiac Imaging Nurse Navigators with any questions/concerns. [phone] Office   Your physician has requested that you have a renal artery duplex. During this test, an ultrasound is used to evaluate blood flow to the kidneys. Allow one hour for this exam. Do not eat after midnight the day before and avoid carbonated beverages. Take your medications as you usually do.   Follow-Up: Your physician recommends that you schedule a follow-up appointment in: 3 months  Any Other Special Instructions Will Be Listed Below (If Applicable).  If you need a refill on your cardiac medications before your next appointment, please call your pharmacy.

## 2023-02-20 NOTE — Telephone Encounter (Signed)
No answer on mobile or home number

## 2023-02-20 NOTE — Telephone Encounter (Signed)
Patient is requesting call back to discuss appt and if this appt could be over the phone. Appt is today at 9:20 A.M.

## 2023-02-20 NOTE — Telephone Encounter (Signed)
Spoke with patient & changed to telephone call.

## 2023-02-20 NOTE — Telephone Encounter (Signed)
  Patient Consent for Virtual Visit        Krystal Alexander has provided verbal consent on 02/20/2023 for a virtual visit (video or telephone).   CONSENT FOR VIRTUAL VISIT FOR:  Krystal Alexander  By participating in this virtual visit I agree to the following:  I hereby voluntarily request, consent and authorize [facility] and its employed or contracted physicians, physician assistants, nurse practitioners or other licensed health care professionals (the Practitioner), to provide me with telemedicine health care services (the "Services") as deemed necessary by the treating Practitioner. I acknowledge and consent to receive the Services by the Practitioner via telemedicine. I understand that the telemedicine visit will involve communicating with the Practitioner through live audiovisual communication technology and the disclosure of certain medical information by electronic transmission. I acknowledge that I have been given the opportunity to request an in-person assessment or other available alternative prior to the telemedicine visit and am voluntarily participating in the telemedicine visit.  I understand that I have the right to withhold or withdraw my consent to the use of telemedicine in the course of my care at any time, without affecting my right to future care or treatment, and that the Practitioner or I may terminate the telemedicine visit at any time. I understand that I have the right to inspect all information obtained and/or recorded in the course of the telemedicine visit and may receive copies of available information for a reasonable fee.  I understand that some of the potential risks of receiving the Services via telemedicine include:  Delay or interruption in medical evaluation due to technological equipment failure or disruption; Information transmitted may not be sufficient (e.g. poor resolution of images) to allow for appropriate medical decision making by the  Practitioner; and/or  In rare instances, security protocols could fail, causing a breach of personal health information.  Furthermore, I acknowledge that it is my responsibility to provide information about my medical history, conditions and care that is complete and accurate to the best of my ability. I acknowledge that Practitioner's advice, recommendations, and/or decision may be based on factors not within their control, such as incomplete or inaccurate data provided by me or distortions of diagnostic images or specimens that may result from electronic transmissions. I understand that the practice of medicine is not an exact science and that Practitioner makes no warranties or guarantees regarding treatment outcomes. I acknowledge that a copy of this consent can be made available to me via my patient portal [facility] MyChart), or I can request a printed copy by calling the office of [facility].    I understand that my insurance will be billed for this visit.   I have read or had this consent read to me. I understand the contents of this consent, which adequately explains the benefits and risks of the Services being provided via telemedicine.  I have been provided ample opportunity to ask questions regarding this consent and the Services and have had my questions answered to my satisfaction. I give my informed consent for the services to be provided through the use of telemedicine in my medical care

## 2023-02-20 NOTE — Progress Notes (Signed)
Virtual Visit via Telephone Note   Because of Krystal Alexander's co-morbid illnesses, she is at least at moderate risk for complications without adequate follow up.  This format is felt to be most appropriate for this patient at this time.  The patient did not have access to video technology/had technical difficulties with video requiring transitioning to audio format only (telephone).  All issues noted in this document were discussed and addressed.  No physical exam could be performed with this format.  Please refer to the patient's chart for her consent to telehealth for [facility]    Date:  02/20/2023   ID:  Krystal Alexander, DOB February 22, 1971, MRN [id number] The patient was identified using 2 identifiers.  Patient Location: Home Provider Location: Office/Clinic   PCP:  Rica Records, FNP   [facility] Providers Cardiologist:  Marjo Bicker, MD     Evaluation Performed:  Follow-Up Visit  Chief Complaint:  HTN f/u  History of Present Illness:    [name] is a 52 y.o. female with HTN, insulin-dependent diabetes type 2, HLD is here for follow-up visit.  Currently on olmesartan-amlodipine-HCTZ 40-10-25 mg once daily.  Not checking her blood pressures at home daily.  Does not have blood pressure machine she said all that was prescribed in the last clinic visit.  No symptoms of angina, DOE, dizziness, syncope, palpitations or leg swelling.  No symptoms of severe HTN like blurry vision, headache.  She underwent echocardiogram in 01/2023 that showed normal LVEF, severe LVH (septal thickness 1.6 cm), no valvular heart disease.   Past Medical History:  Diagnosis Date   Hyperlipidemia    Hypertension    Type 2 diabetes mellitus (HCC)    No past surgical history on file.   Current Meds  Medication Sig   atorvastatin (LIPITOR) 40 MG tablet Take 1 tablet (40 mg total) by mouth daily.   Blood Glucose Monitoring Suppl DEVI 1 each by Does not  apply route in the morning, at noon, and at bedtime. May substitute to any manufacturer covered by patient's insurance.   Blood Pressure KIT 1 kit by Does not apply route daily.   Continuous Glucose Sensor (DEXCOM G7 SENSOR) MISC Use as directed   insulin glargine (LANTUS) 100 UNIT/ML injection Inject 0.1 mLs (10 Units total) into the skin every evening.   Lancets (FREESTYLE) lancets Use as instructed   metFORMIN (GLUCOPHAGE) 1000 MG tablet Take 1 tablet (1,000 mg total) by mouth 2 (two) times daily with a meal.   Olmesartan-amLODIPine-HCTZ 40-10-25 MG TABS Take 1 tablet by mouth daily.     Allergies:   Benazepril hcl   Social History   Tobacco Use   Smoking status: Never   Smokeless tobacco: Never  Substance Use Topics   Alcohol use: Never     Family Hx: The patient's family history includes Diabetes in her maternal grandmother and paternal grandmother; Hypertension in her father and mother; Lung cancer in her maternal grandmother.  ROS:   Please see the history of present illness.     All other systems reviewed and are negative.   Prior CV studies:   The following studies were reviewed today:    Labs/Other Tests and Data Reviewed:    EKG:    Recent Labs: 05/29/2022: ALT 11; TSH 2.480 12/22/2022: BUN 11; Creatinine, Ser 0.73; Hemoglobin 9.9; Platelets 379; Potassium 4.3; Sodium 136   Recent Lipid Panel Lab Results  Component Value Date/Time   CHOL 223 (H)  12/22/2022 09:54 AM   TRIG 150 (H) 12/22/2022 09:54 AM   HDL 51 12/22/2022 09:54 AM   CHOLHDL 4.4 12/22/2022 09:54 AM   CHOLHDL 3.3 Ratio 09/18/2008 09:59 PM   LDLCALC 145 (H) 12/22/2022 09:54 AM    Wt Readings from Last 3 Encounters:  02/20/23 174 lb (78.9 kg)  01/05/23 179 lb (81.2 kg)  01/05/23 179 lb (81.2 kg)     Risk Assessment/Calculations:          Objective:    Vital Signs:  Ht 5\' 4"  (1.626 m)   Wt 174 lb (78.9 kg)   LMP  (LMP Unknown)   BMI 29.87 kg/m      ASSESSMENT & PLAN:      HTN, unclear if controlled or not: Does not check blood pressures at home.  Does not have BP machine at home.  Will prescribe BP equipment.  Continue current antihypertensive, olmesartan-amlodipine-HCTZ 40-10-25 mg once daily.  Check BPs in a.m. and p.m., keep a log and bring to the next clinic visit.  Renal artery Doppler was ordered in the last clinic visit but she had transportation issues to drive to [place].  She says she has transportation in February and hopefully can get this done.  Severe LVH: Echocardiogram from 01/2023 showed normal LVEF, severe LVH (septal thickness 1.6 cm), no valvular heart disease.  She has a family history of congestive heart failure in her parents.  Obtain cardiac MRI to rule out HCM.  She wanted cardiac MRI to be scheduled in February as she will have transportation to [place] at that time.  IDDM2: On insulin, metformin, glimepiride and Ozempic for DM2 management, follows with PCP.  HLD, not at goal: On atorvastatin 40 mg nightly for HLD, goal LDL less than 100. Ldl 145 around two months ago.      Time:   Today, I have spent 20 minutes with the patient with telehealth technology discussing the above problems.     Medication Adjustments/Labs and Tests Ordered: Current medicines are reviewed at length with the patient today.  Concerns regarding medicines are outlined above.   Tests Ordered: Orders Placed This Encounter  Procedures   MR CARDIAC MORPHOLOGY W WO CONTRAST   US RENAL ARTERY DUPLEX COMPLETE   CBC    Medication Changes: Meds ordered this encounter  Medications   Blood Pressure KIT    Sig: 1 kit by Does not apply route daily.    Dispense:  1 kit    Refill:  0    Follow Up:    3 months in person visit  Signed, Marjo Bicker, MD  02/20/2023 11:41 AM    [facility]

## 2023-02-20 NOTE — Telephone Encounter (Signed)
The patient verbally consented for a telehealth phone visit with [facility] and understands that his/her insurance company will be billed for the encounter.  

## 2023-04-27 ENCOUNTER — Ambulatory Visit (HOSPITAL_COMMUNITY): Admission: RE | Admit: 2023-04-27 | Payer: Medicaid Other | Source: Ambulatory Visit

## 2023-05-09 ENCOUNTER — Encounter: Payer: Self-pay | Admitting: Family Medicine

## 2023-05-09 ENCOUNTER — Ambulatory Visit: Payer: Medicaid Other | Admitting: Family Medicine

## 2023-05-09 VITALS — BP 162/99 | HR 103 | Ht 64.0 in | Wt 171.0 lb

## 2023-05-09 DIAGNOSIS — I10 Essential (primary) hypertension: Secondary | ICD-10-CM | POA: Diagnosis not present

## 2023-05-09 DIAGNOSIS — E1165 Type 2 diabetes mellitus with hyperglycemia: Secondary | ICD-10-CM

## 2023-05-09 DIAGNOSIS — Z1211 Encounter for screening for malignant neoplasm of colon: Secondary | ICD-10-CM

## 2023-05-09 DIAGNOSIS — Z794 Long term (current) use of insulin: Secondary | ICD-10-CM

## 2023-05-09 DIAGNOSIS — Z1231 Encounter for screening mammogram for malignant neoplasm of breast: Secondary | ICD-10-CM

## 2023-05-09 DIAGNOSIS — E559 Vitamin D deficiency, unspecified: Secondary | ICD-10-CM

## 2023-05-09 DIAGNOSIS — E038 Other specified hypothyroidism: Secondary | ICD-10-CM | POA: Diagnosis not present

## 2023-05-09 MED ORDER — CLONIDINE HCL 0.2 MG PO TABS: 0.2000 mg | ORAL_TABLET | Freq: Two times a day (BID) | ORAL | 2 refills | Status: DC

## 2023-05-09 MED ORDER — METOPROLOL SUCCINATE ER 50 MG PO TB24: 50.0000 mg | ORAL_TABLET | Freq: Every day | ORAL | 3 refills | Status: AC

## 2023-05-09 MED ORDER — LANCETS MISC. MISC: 1.0000 | Freq: Three times a day (TID) | 0 refills | Status: AC

## 2023-05-09 MED ORDER — BLOOD GLUCOSE MONITORING SUPPL DEVI: 1.0000 | Freq: Three times a day (TID) | 0 refills | Status: AC

## 2023-05-09 MED ORDER — BLOOD GLUCOSE TEST VI STRP: 1.0000 | ORAL_STRIP | Freq: Two times a day (BID) | 1 refills | Status: AC

## 2023-05-09 MED ORDER — METOPROLOL SUCCINATE ER 50 MG PO TB24: 50.0000 mg | ORAL_TABLET | Freq: Every day | ORAL | 3 refills | Status: DC

## 2023-05-09 MED ORDER — LANCET DEVICE MISC: 1.0000 | Freq: Three times a day (TID) | 0 refills | Status: AC

## 2023-05-09 NOTE — Assessment & Plan Note (Signed)
 Vitals:   05/09/23 1027 05/09/23 1103  BP: (!) 188/101 (!) 162/99  Patient is being followed by Cardiology Start Metoprolol 50 mg and Clonidine 0.2 mg twice daily Continue Olmesartan-amLODIPine-HCTZ 40-10-25 MG once daily Labs ordered. Discussed with  patient to monitor their blood pressure regularly and maintain a heart-healthy diet rich in fruits, vegetables, whole grains, and low-fat dairy, while reducing sodium intake to less than 2,300 mg per day. Regular physical activity, such as 30 minutes of moderate exercise most days of the week, will help lower blood pressure and improve overall cardiovascular health. Avoiding smoking, limiting alcohol consumption, and managing stress. Take  prescribed medication, & take it as directed and avoid skipping doses. Seek emergency care if your blood pressure is (over 180/100) or you experience chest pain, shortness of breath, or sudden vision changes.Patient verbalizes understanding regarding plan of care and all questions answered.

## 2023-05-09 NOTE — Progress Notes (Signed)
 Established Patient Office Visit   Subjective  Patient ID: Krystal Alexander, female    DOB: [birth year]  Age: 53 y.o. MRN: [id number]  Chief Complaint  Patient presents with   Medical Management of Chronic Issues    4 month Chronic follow up.  Trouble syncing dexcom sensor to app.     She  has a past medical history of Hyperlipidemia, Hypertension, and Type 2 diabetes mellitus (HCC).  Diabetes She presents for her follow-up diabetic visit. She has type 2 diabetes mellitus. There are no hypoglycemic associated symptoms. Pertinent negatives for diabetes include no chest pain, no fatigue and no weakness. Pertinent negatives for hypoglycemia complications include no blackouts. Pertinent negatives for diabetic complications include no peripheral neuropathy. Risk factors for coronary artery disease include diabetes mellitus, dyslipidemia, hypertension and obesity. Current diabetic treatment includes insulin injections and oral agent (monotherapy). She is compliant with treatment most of the time. She is following a generally healthy diet. She rarely participates in exercise. An ACE inhibitor/angiotensin II receptor blocker is being taken. She does not see a podiatrist.Eye exam is not current.    Review of Systems  Constitutional:  Negative for chills, fatigue and fever.  Respiratory:  Negative for shortness of breath.   Cardiovascular:  Negative for chest pain.  Gastrointestinal:  Negative for abdominal pain.  Neurological:  Negative for weakness.      Objective:     BP (!) 162/99   Pulse (!) 103   Ht 5\' 4"  (1.626 m)   Wt 171 lb 0.6 oz (77.6 kg)   LMP  (LMP Unknown)   SpO2 96%   BMI 29.36 kg/m  BP Readings from Last 3 Encounters:  05/09/23 (!) 162/99  01/05/23 (!) 174/108  01/05/23 (!) 170/98      Physical Exam Vitals reviewed.  Constitutional:      General: She is not in acute distress.    Appearance: Normal appearance. She is not ill-appearing, toxic-appearing or  diaphoretic.  HENT:     Head: Normocephalic.  Eyes:     General:        Right eye: No discharge.        Left eye: No discharge.     Conjunctiva/sclera: Conjunctivae normal.  Cardiovascular:     Rate and Rhythm: Normal rate.     Pulses: Normal pulses.     Heart sounds: Normal heart sounds.  Pulmonary:     Effort: Pulmonary effort is normal. No respiratory distress.     Breath sounds: Normal breath sounds.  Abdominal:     General: Bowel sounds are normal.     Palpations: Abdomen is soft.     Tenderness: There is no abdominal tenderness. There is no right CVA tenderness, left CVA tenderness or guarding.  Musculoskeletal:        General: Normal range of motion.     Cervical back: Normal range of motion.  Skin:    General: Skin is warm and dry.     Capillary Refill: Capillary refill takes less than 2 seconds.  Neurological:     General: No focal deficit present.     Mental Status: She is alert and oriented to person, place, and time.     Coordination: Coordination normal.     Gait: Gait normal.  Psychiatric:        Mood and Affect: Mood normal.        Behavior: Behavior normal.      No results found for any visits on 05/09/23.  The  10-year ASCVD risk score (Arnett DK, et al., 2019) is: 24.8%    Assessment & Plan:  Type 2 diabetes mellitus with hyperglycemia, with long-term current use of insulin (HCC) -     Microalbumin / creatinine urine ratio -     Hemoglobin A1c -     Ambulatory referral to Ophthalmology -     Blood Glucose Monitoring Suppl; 1 each by Does not apply route in the morning, at noon, and at bedtime. May substitute to any manufacturer covered by patient's insurance.  Dispense: 1 each; Refill: 0 -     Blood Glucose Test; 1 each by In Vitro route in the morning and at bedtime. May substitute to any manufacturer covered by patient's insurance.  Dispense: 180 strip; Refill: 1 -     Lancet Device; 1 each by Does not apply route in the morning, at noon, and at  bedtime. May substitute to any manufacturer covered by patient's insurance.  Dispense: 1 each; Refill: 0 -     Lancets Misc.; 1 each by Does not apply route in the morning, at noon, and at bedtime. May substitute to any manufacturer covered by patient's insurance.  Dispense: 100 each; Refill: 0  Primary hypertension Assessment & Plan: Vitals:   05/09/23 1027 05/09/23 1103  BP: (!) 188/101 (!) 162/99  Patient is being followed by Cardiology Start Metoprolol 50 mg and Clonidine 0.2 mg twice daily Continue Olmesartan-amLODIPine-HCTZ 40-10-25 MG once daily Labs ordered. Discussed with  patient to monitor their blood pressure regularly and maintain a heart-healthy diet rich in fruits, vegetables, whole grains, and low-fat dairy, while reducing sodium intake to less than 2,300 mg per day. Regular physical activity, such as 30 minutes of moderate exercise most days of the week, will help lower blood pressure and improve overall cardiovascular health. Avoiding smoking, limiting alcohol consumption, and managing stress. Take  prescribed medication, & take it as directed and avoid skipping doses. Seek emergency care if your blood pressure is (over 180/100) or you experience chest pain, shortness of breath, or sudden vision changes.Patient verbalizes understanding regarding plan of care and all questions answered.   Orders: -     Lipid panel -     CMP14+EGFR -     CBC with Differential/Platelet  Screening for colon cancer -     Ambulatory referral to Gastroenterology  Encounter for screening mammogram for malignant neoplasm of breast -     3D Screening Mammogram, Left and Right; Future  TSH (thyroid-stimulating hormone deficiency) -     TSH + free T4  Vitamin D deficiency -     VITAMIN D 25 Hydroxy (Vit-D Deficiency, Fractures)  Other orders -     cloNIDine HCl; Take 1 tablet (0.2 mg total) by mouth 2 (two) times daily.  Dispense: 60 tablet; Refill: 2 -     Metoprolol Succinate ER; Take 1  tablet (50 mg total) by mouth daily. Take with or immediately following a meal.  Dispense: 30 tablet; Refill: 3    Return in about 3 months (around 08/09/2023), or if symptoms worsen or fail to improve, for hypertension, type 2 diabetes.   Cruzita Lederer Newman Nip, FNP

## 2023-05-09 NOTE — Assessment & Plan Note (Signed)
 Last Hemoglobin A1c: 10.9, patient refused endocrinology referral  Labs: Ordered today, results pending; will follow up accordingly. The patient reports adhering to prescribed medications: Metformin 1000 mg twice daily and Lantus 10 units at bedtime  Reviewed non-pharmacological interventions, including a balanced diet rich in lean proteins, healthy fats, whole grains, and high-fiber vegetables. Emphasized reducing refined sugars and processed carbohydrates, and incorporating more fruits, leafy greens, and legumes. Education: Patient was educated on recognizing signs and symptoms of both hypoglycemia and hyperglycemia, and advised to seek emergency care if these symptoms occur. Follow-Up: Scheduled for follow-up in 3-4 months, or sooner if needed. Patient Understanding: The patient verbalized understanding of the care plan, and all questions were answered. Additional Care: Ophthalmology referral was placed. Foot exam results were within normal limits.

## 2023-05-09 NOTE — Patient Instructions (Addendum)
        Great to see you today.  I have refilled the medication(s) we provide.     Check Blood Glucose 2-3 times daily   Fasting Blood Glucose (Before Meals): Target for Diabetes Management: 80-130     Postprandial Blood Glucose (1-2 Hours After Meals): Target for Diabetes Management: Less than 180          If labs were collected, we will inform you of lab results once received either by echart message or telephone call.   - echart message- for normal results that have been seen by the patient already.   - telephone call: abnormal results or if patient has not viewed results in their echart.   - Please take medications as prescribed. - Follow up with your primary health provider if any health concerns arises. - If symptoms worsen please contact your primary care provider and/or visit the emergency department.

## 2023-05-10 ENCOUNTER — Telehealth: Payer: Self-pay | Admitting: Pharmacy Technician

## 2023-05-10 ENCOUNTER — Other Ambulatory Visit (HOSPITAL_COMMUNITY): Payer: Self-pay

## 2023-05-10 ENCOUNTER — Other Ambulatory Visit: Payer: Self-pay | Admitting: Family Medicine

## 2023-05-10 DIAGNOSIS — E1165 Type 2 diabetes mellitus with hyperglycemia: Secondary | ICD-10-CM

## 2023-05-10 LAB — CMP14+EGFR
ALT: 6 IU/L (ref 0–32)
AST: 8 IU/L (ref 0–40)
Albumin: 4.4 g/dL (ref 3.8–4.9)
Alkaline Phosphatase: 113 IU/L (ref 44–121)
BUN/Creatinine Ratio: 22 (ref 9–23)
BUN: 22 mg/dL (ref 6–24)
Bilirubin Total: 0.4 mg/dL (ref 0.0–1.2)
CO2: 24 mmol/L (ref 20–29)
Calcium: 9.9 mg/dL (ref 8.7–10.2)
Chloride: 95 mmol/L — ABNORMAL LOW (ref 96–106)
Creatinine, Ser: 0.98 mg/dL (ref 0.57–1.00)
Globulin, Total: 3.8 g/dL (ref 1.5–4.5)
Glucose: 352 mg/dL — ABNORMAL HIGH (ref 70–99)
Potassium: 4.9 mmol/L (ref 3.5–5.2)
Sodium: 137 mmol/L (ref 134–144)
Total Protein: 8.2 g/dL (ref 6.0–8.5)
eGFR: 70 mL/min/{1.73_m2} (ref >59)

## 2023-05-10 LAB — LIPID PANEL
Chol/HDL Ratio: 5.2 ratio — ABNORMAL HIGH (ref 0.0–4.4)
Cholesterol, Total: 266 mg/dL — ABNORMAL HIGH (ref 100–199)
HDL: 51 mg/dL (ref >39)
LDL Chol Calc (NIH): 185 mg/dL — ABNORMAL HIGH (ref 0–99)
Triglycerides: 164 mg/dL — ABNORMAL HIGH (ref 0–149)
VLDL Cholesterol Cal: 30 mg/dL (ref 5–40)

## 2023-05-10 LAB — MICROALBUMIN / CREATININE URINE RATIO
Creatinine, Urine: 97 mg/dL
Microalb/Creat Ratio: 388 mg/g{creat} — ABNORMAL HIGH (ref 0–29)
Microalbumin, Urine: 376.7 ug/mL

## 2023-05-10 LAB — CBC WITH DIFFERENTIAL/PLATELET
Basophils Absolute: 0 10*3/uL (ref 0.0–0.2)
Basos: 0 %
EOS (ABSOLUTE): 0.1 10*3/uL (ref 0.0–0.4)
Eos: 1 %
Hematocrit: 31.4 % — ABNORMAL LOW (ref 34.0–46.6)
Hemoglobin: 10.1 g/dL — ABNORMAL LOW (ref 11.1–15.9)
Immature Grans (Abs): 0 10*3/uL (ref 0.0–0.1)
Immature Granulocytes: 0 %
Lymphocytes Absolute: 1.9 10*3/uL (ref 0.7–3.1)
Lymphs: 23 %
MCH: 25.8 pg — ABNORMAL LOW (ref 26.6–33.0)
MCHC: 32.2 g/dL (ref 31.5–35.7)
MCV: 80 fL (ref 79–97)
Monocytes Absolute: 0.6 10*3/uL (ref 0.1–0.9)
Monocytes: 7 %
Neutrophils Absolute: 5.7 10*3/uL (ref 1.4–7.0)
Neutrophils: 69 %
Platelets: 380 10*3/uL (ref 150–450)
RBC: 3.92 x10E6/uL (ref 3.77–5.28)
RDW: 13.3 % (ref 11.7–15.4)
WBC: 8.4 10*3/uL (ref 3.4–10.8)

## 2023-05-10 LAB — TSH+FREE T4
Free T4: 1.37 ng/dL (ref 0.82–1.77)
TSH: 2.27 u[IU]/mL (ref 0.450–4.500)

## 2023-05-10 LAB — VITAMIN D 25 HYDROXY (VIT D DEFICIENCY, FRACTURES): Vit D, 25-Hydroxy: 15.6 ng/mL — ABNORMAL LOW (ref 30.0–100.0)

## 2023-05-10 LAB — HEMOGLOBIN A1C
Est. average glucose Bld gHb Est-mCnc: 301 mg/dL
Hgb A1c MFr Bld: 12.1 % — ABNORMAL HIGH (ref 4.8–5.6)

## 2023-05-10 MED ORDER — INSULIN GLARGINE 100 UNIT/ML ~~LOC~~ SOLN: 20.0000 [IU] | Freq: Every evening | SUBCUTANEOUS | 2 refills | Status: DC

## 2023-05-10 MED ORDER — ATORVASTATIN CALCIUM 80 MG PO TABS: 80.0000 mg | ORAL_TABLET | Freq: Every day | ORAL | 3 refills | Status: AC

## 2023-05-10 MED ORDER — GLIPIZIDE 10 MG PO TABS: 10.0000 mg | ORAL_TABLET | Freq: Two times a day (BID) | ORAL | 3 refills | Status: DC

## 2023-05-10 MED ORDER — TRULICITY 0.75 MG/0.5ML ~~LOC~~ SOAJ: 0.7500 mg | SUBCUTANEOUS | 0 refills | Status: DC

## 2023-05-10 NOTE — Progress Notes (Signed)
 Please inform patient,  Hemoglobin A1c 12.1 not controlled- referral placed to endocrinology  Plan: Continue Metformin 1000 mg twice daily, Lantus 20 units at bedtime. Start Glipizide 10 mg twice daily and Trulicity 0.75 mg weekly injections   Cholesterol levels elevated, start lifestyle modifications and follow diet low in saturated fat. Plan: Increased to Lipitor 80 mg once daily  Diet to Lower Cholesterol Eat More: Oats, beans, and lentils: High in soluble fiber. Fatty fish: Salmon, tuna (rich in omega-3s). Nuts and seeds: Almonds, walnuts, flaxseeds. Fruits and vegetables: Apples, berries, leafy greens. Healthy fats: Olive oil, avocado. Limit: Saturated fats: Butter, cream, fatty meats. Trans fats: Fried foods, processed snacks. Sugar and refined carbs: Sweets, white bread. Focus on whole foods, healthy fats, and fiber to improve heart health! Maintain an exercise routine 3 to 5 days a week for a minimum total of 150 minutes.    Vitamin D levels low, I advise to taking  over the counter supplements of vitamin D 1000 IU/day to prevent low vitamin D levels.  Consume Vitamin D-Rich Foods: Fatty Fish: Salmon, mackerel, tuna, and sardines. Egg Yolks: A good source if eaten whole. Fortified Foods: Milk, orange juice, cereals, and plant-based milks (like almond or soy milk). Mushrooms: Especially those exposed to sunlight or UV light. Cod Liver Oil: A concentrated source of vitamin D. Including these foods in your diet can help boost vitamin D levels   Symptoms of Low Vitamin D:  Fatigue and low energy. Bone pain and muscle weakness. Increased risk of fractures or weak bones. Frequent illness or infections. Mood changes, like depression or anxiety. Hair loss or thinning.      CBC panel low, may indicate iron deficiency anemia. I advise to consume iron rich foods such as Red meat, pork, poultry, seafood, beans, dark green leafy vegetables, such as spinach, dried fruit,  such as raisins and apricots. Iron-fortified cereals, and peas. I encourage to take over the counter iron tablets 65 mg once daily. In our next visit we'll repeat labs.  Gastrointestinal side effects are common with Iron tablet intake such as : constipation, nausea, abdominal discomfort, and dark stools.  Manage Constipation Tips: Hydration: Drink plenty of water throughout the day. High-Fiber Diet: Include fruits, vegetables, and whole grains to promote bowel movements. Stool Softeners or Laxatives OTC: Docusate sodium (Colace): A gentle stool softener. Polyethylene glycol (MiraLAX): Effective for occasional constipation.

## 2023-05-10 NOTE — Telephone Encounter (Signed)
 Pharmacy Patient Advocate Encounter   Received notification from Onbase that prior authorization for TRULICITY 0.75MG /0.5ML AUTO-INJECTORS is required/requested.   Insurance verification completed.   The patient is insured through [facility] [place] .   Per test claim: PA required; PA submitted to above mentioned insurance via CoverMyMeds Key/confirmation #/EOC [id number] Status is pending

## 2023-05-10 NOTE — Telephone Encounter (Signed)
 Pharmacy Patient Advocate Encounter   Received notification from Onbase that prior authorization for INSULIN GLARGINE-YFGN 100N/ML SOLUTION is required/requested.   Insurance verification completed.   The patient is insured through [facility] [place] .   Per test claim:  BRAND NAME LANTUS is preferred by the insurance.  If suggested medication is appropriate, Please send in a new RX and discontinue this one. If not, please advise as to why it's not appropriate so that we may request a Prior Authorization. Please note, some preferred medications may still require a PA.  If the suggested medications have not been trialed and there are no contraindications to their use, the PA will not be submitted, as it will not be approved.  No need to send new prescription. This can be substituted at the pharmacy. Called pharmacy for reprocessing. $4.00 copay.

## 2023-05-11 ENCOUNTER — Other Ambulatory Visit (HOSPITAL_COMMUNITY): Payer: Self-pay

## 2023-05-11 NOTE — Telephone Encounter (Signed)
 Pharmacy Patient Advocate Encounter  Received notification from [facility] that Prior Authorization for TRULICITY 0.75MG /0.5ML AUTO-INJECTORS has been APPROVED from 05/10/2023 to 05/09/2024. Ran test claim, Copay is $4.00. This test claim was processed through [facility]- copay amounts may vary at other pharmacies due to pharmacy/plan contracts, or as the patient moves through the different stages of their insurance plan.   PA #/Case ID/Reference #: [id number]

## 2023-05-17 ENCOUNTER — Encounter: Payer: Self-pay | Admitting: *Deleted

## 2023-07-03 ENCOUNTER — Other Ambulatory Visit: Payer: Self-pay | Admitting: Family Medicine

## 2023-07-18 ENCOUNTER — Ambulatory Visit: Admission: RE | Admit: 2023-07-18 | Source: Ambulatory Visit

## 2023-08-09 ENCOUNTER — Ambulatory Visit: Admitting: Family Medicine

## 2023-08-15 ENCOUNTER — Telehealth: Payer: Self-pay | Admitting: Family Medicine

## 2023-08-15 NOTE — Telephone Encounter (Signed)
 Called patient left a voicemail to reach our office to schedule a diabetic eye exam.

## 2023-08-16 ENCOUNTER — Other Ambulatory Visit: Payer: Self-pay | Admitting: Family Medicine

## 2023-08-16 DIAGNOSIS — E1165 Type 2 diabetes mellitus with hyperglycemia: Secondary | ICD-10-CM

## 2023-09-05 ENCOUNTER — Ambulatory Visit: Admission: RE | Admit: 2023-09-05 | Source: Ambulatory Visit

## 2023-09-11 ENCOUNTER — Telehealth: Payer: Self-pay | Admitting: Internal Medicine

## 2023-09-11 NOTE — Telephone Encounter (Signed)
-----   Message from [facility] Turkey G sent at 09/11/2023 11:22 AM EDT ----- No last MRI was canceled needs to be r/s and renal was never scheduled ----- Message ----- From: Delbra Krebs T Sent: 09/11/2023  11:04 AM EDT To: Richerd Dec  Have you by chance spoken with patient? ----- Message ----- From: Dec Richerd Sent: 09/11/2023  10:39 AM EDT To: Lurena Hershal Car Scheduling  Please schedule patient cardiac MRI and renal ----- Message ----- From: Johnnye Littie HERO, CMA Sent: 03/01/2023  12:00 AM EDT To: Littie HERO Johnnye, CMA  Ordered Cardiac MRI and renal Doppler check to see when patient is scheduled

## 2023-09-11 NOTE — Telephone Encounter (Signed)
 LMOVM asking patient to call the [facility] regarding testing ordered by Dr. Mallipeddi. That was scheduled but patient cancelled.

## 2023-09-17 ENCOUNTER — Telehealth: Payer: Self-pay | Admitting: Internal Medicine

## 2023-09-18 ENCOUNTER — Telehealth: Payer: Self-pay | Admitting: Internal Medicine

## 2023-09-18 NOTE — Telephone Encounter (Signed)
 LMOVM - checking with patient to see if she wants to -reschedule test ordered by Dr. Mallipeddi.  Ordered Cardiac MRI and renal Doppler

## 2023-09-21 NOTE — Telephone Encounter (Signed)
 Called patient to try and verify if she still wants to schedule the US  Renal Study. Unable to reach patient.

## 2023-10-04 ENCOUNTER — Ambulatory Visit: Admitting: Family Medicine

## 2023-11-12 ENCOUNTER — Other Ambulatory Visit: Payer: Self-pay | Admitting: Family Medicine

## 2023-11-20 ENCOUNTER — Encounter: Payer: Self-pay | Admitting: *Deleted

## 2023-11-22 ENCOUNTER — Ambulatory Visit (INDEPENDENT_AMBULATORY_CARE_PROVIDER_SITE_OTHER): Payer: Self-pay

## 2023-11-22 VITALS — BP 115/75 | HR 78 | Ht 64.0 in | Wt 163.1 lb

## 2023-11-22 DIAGNOSIS — D509 Iron deficiency anemia, unspecified: Secondary | ICD-10-CM

## 2023-11-22 DIAGNOSIS — E1165 Type 2 diabetes mellitus with hyperglycemia: Secondary | ICD-10-CM | POA: Diagnosis not present

## 2023-11-22 DIAGNOSIS — E1169 Type 2 diabetes mellitus with other specified complication: Secondary | ICD-10-CM

## 2023-11-22 DIAGNOSIS — E785 Hyperlipidemia, unspecified: Secondary | ICD-10-CM

## 2023-11-22 DIAGNOSIS — E559 Vitamin D deficiency, unspecified: Secondary | ICD-10-CM

## 2023-11-22 DIAGNOSIS — Z794 Long term (current) use of insulin: Secondary | ICD-10-CM

## 2023-11-22 MED ORDER — PEN NEEDLES 32G X 4 MM MISC: 1.0000 | Freq: Every day | 5 refills | Status: AC

## 2023-11-22 MED ORDER — METFORMIN HCL 1000 MG PO TABS: 1000.0000 mg | ORAL_TABLET | Freq: Two times a day (BID) | ORAL | 1 refills | Status: AC

## 2023-11-22 NOTE — Progress Notes (Signed)
 Established Patient Office Visit  Subjective   Patient ID: Krystal Alexander, female    DOB: December 18, 1970  Age: 53 y.o. MRN: [id number]  Chief Complaint  Patient presents with   Medical Management of Chronic Issues    3 month follow up    HPI Discussed the use of AI scribe software for clinical note transcription with the patient, who gave verbal consent to proceed.  History of Present Illness   Krystal Alexander is a 53 year old female with diabetes who presents for follow-up and blood work recheck.  Diabetic neuropathy symptoms - Persistent tingling in feet when walking long distances - Symptom attributed to diabetes  Diabetes management - Currently taking metformin  - Requires pen needles for Trulicity  and Lantus  injections - Has not been using Trulicity  and Lantus  due to lack of pen needles - Needs refills for pen needles and metformin   Imaging and diagnostic barriers - Ultrasound was ordered but not completed due to lack of Medicaid coverage      Patient Active Problem List   Diagnosis Date Noted   Uncontrolled type 2 diabetes mellitus with hyperglycemia (HCC) 11/25/2023   Iron deficiency anemia 11/25/2023   Vitamin D  deficiency 11/25/2023   Hyperlipidemia associated with type 2 diabetes mellitus (HCC) 11/25/2023   Severe left ventricular hypertrophy 02/20/2023   HYPERLIPIDEMIA 02/22/2009   DIABETES MELLITUS, TYPE II 09/08/2008   OBESITY, UNSPECIFIED 09/08/2008   Hypertension 09/08/2008      ROS    Objective:     BP 115/75   Pulse 78   Ht 5' 4 (1.626 m)   Wt 163 lb 1.3 oz (74 kg)   LMP  (LMP Unknown)   SpO2 97%   BMI 27.99 kg/m  BP Readings from Last 3 Encounters:  11/22/23 115/75  05/09/23 (!) 162/99  01/05/23 (!) 174/108   Wt Readings from Last 3 Encounters:  11/22/23 163 lb 1.3 oz (74 kg)  05/09/23 171 lb 0.6 oz (77.6 kg)  02/20/23 174 lb (78.9 kg)      Physical Exam Vitals and nursing note reviewed.  Constitutional:      Appearance:  Normal appearance.  HENT:     Head: Normocephalic.  Eyes:     Extraocular Movements: Extraocular movements intact.     Pupils: Pupils are equal, round, and reactive to light.  Cardiovascular:     Rate and Rhythm: Normal rate and regular rhythm.  Pulmonary:     Effort: Pulmonary effort is normal.     Breath sounds: Normal breath sounds.  Musculoskeletal:     Cervical back: Normal range of motion and neck supple.  Neurological:     Mental Status: She is alert and oriented to person, place, and time.  Psychiatric:        Mood and Affect: Mood normal.        Thought Content: Thought content normal.       The 10-year ASCVD risk score (Arnett DK, et al., 2019) is: 9.7%    Assessment & Plan:   Problem List Items Addressed This Visit       Endocrine   Uncontrolled type 2 diabetes mellitus with hyperglycemia (HCC) - Primary    Type 2 diabetes mellitus with suspected diabetic neuropathy Tingling in feet suggests neuropathy. Blood pressure controlled. A1c previously high. - Order blood work for diabetes control. - Send pen needles and metformin  prescription to [facility]. - Ensure medication refills. - Advise follow-up in 3 months.      Relevant Medications  metFORMIN  (GLUCOPHAGE ) 1000 MG tablet   Insulin  Pen Needle (PEN NEEDLES) 32G X 4 MM MISC   Other Relevant Orders   CMP14+EGFR (Completed)   Hemoglobin A1c (Completed)   Hyperlipidemia associated with type 2 diabetes mellitus (HCC)   Recheck fasting labs.  Currently prescribed atorvastatin  80 mg.       Relevant Medications   metFORMIN  (GLUCOPHAGE ) 1000 MG tablet   Other Relevant Orders   CMP14+EGFR (Completed)   Lipid panel (Completed)     Other   Iron deficiency anemia   Recheck levels.        Relevant Orders   CBC with Differential/Platelet (Completed)   Fe+TIBC+Fer (Completed)   Vitamin D  deficiency   Recheck vitamin D  levels.       Relevant Orders   Vitamin D  (25 hydroxy) (Completed)   Return in about 3  months (around 02/21/2024) for as scheduled with PCP.    Leita Longs, FNP

## 2023-11-23 LAB — CBC WITH DIFFERENTIAL/PLATELET
Basophils Absolute: 0 x10E3/uL (ref 0.0–0.2)
Basos: 0 %
EOS (ABSOLUTE): 0.1 x10E3/uL (ref 0.0–0.4)
Eos: 1 %
Hematocrit: 29.5 % — ABNORMAL LOW (ref 34.0–46.6)
Hemoglobin: 9 g/dL — ABNORMAL LOW (ref 11.1–15.9)
Immature Grans (Abs): 0 x10E3/uL (ref 0.0–0.1)
Immature Granulocytes: 0 %
Lymphocytes Absolute: 1.7 x10E3/uL (ref 0.7–3.1)
Lymphs: 23 %
MCH: 25.5 pg — ABNORMAL LOW (ref 26.6–33.0)
MCHC: 30.5 g/dL — ABNORMAL LOW (ref 31.5–35.7)
MCV: 84 fL (ref 79–97)
Monocytes Absolute: 0.6 x10E3/uL (ref 0.1–0.9)
Monocytes: 7 %
Neutrophils Absolute: 5.1 x10E3/uL (ref 1.4–7.0)
Neutrophils: 69 %
Platelets: 387 x10E3/uL (ref 150–450)
RBC: 3.53 x10E6/uL — ABNORMAL LOW (ref 3.77–5.28)
RDW: 13.1 % (ref 11.7–15.4)
WBC: 7.4 x10E3/uL (ref 3.4–10.8)

## 2023-11-23 LAB — CMP14+EGFR
ALT: 7 IU/L (ref 0–32)
AST: 7 IU/L (ref 0–40)
Albumin: 4.1 g/dL (ref 3.8–4.9)
Alkaline Phosphatase: 107 IU/L (ref 49–135)
BUN/Creatinine Ratio: 26 — ABNORMAL HIGH (ref 9–23)
BUN: 32 mg/dL — ABNORMAL HIGH (ref 6–24)
Bilirubin Total: 0.3 mg/dL (ref 0.0–1.2)
CO2: 21 mmol/L (ref 20–29)
Calcium: 9.4 mg/dL (ref 8.7–10.2)
Chloride: 95 mmol/L — ABNORMAL LOW (ref 96–106)
Creatinine, Ser: 1.22 mg/dL — ABNORMAL HIGH (ref 0.57–1.00)
Globulin, Total: 3.4 g/dL (ref 1.5–4.5)
Glucose: 448 mg/dL — ABNORMAL HIGH (ref 70–99)
Potassium: 4.7 mmol/L (ref 3.5–5.2)
Sodium: 134 mmol/L (ref 134–144)
Total Protein: 7.5 g/dL (ref 6.0–8.5)
eGFR: 54 mL/min/1.73 — ABNORMAL LOW (ref >59)

## 2023-11-23 LAB — IRON,TIBC AND FERRITIN PANEL
Ferritin: 25 ng/mL (ref 15–150)
Iron Saturation: 14 % — ABNORMAL LOW (ref 15–55)
Iron: 43 ug/dL (ref 27–159)
Total Iron Binding Capacity: 317 ug/dL (ref 250–450)
UIBC: 274 ug/dL (ref 131–425)

## 2023-11-23 LAB — HEMOGLOBIN A1C
Est. average glucose Bld gHb Est-mCnc: 352 mg/dL
Hgb A1c MFr Bld: 13.9 % — ABNORMAL HIGH (ref 4.8–5.6)

## 2023-11-23 LAB — VITAMIN D 25 HYDROXY (VIT D DEFICIENCY, FRACTURES): Vit D, 25-Hydroxy: 13.6 ng/mL — ABNORMAL LOW (ref 30.0–100.0)

## 2023-11-23 LAB — LIPID PANEL
Chol/HDL Ratio: 5.9 ratio — ABNORMAL HIGH (ref 0.0–4.4)
Cholesterol, Total: 253 mg/dL — ABNORMAL HIGH (ref 100–199)
HDL: 43 mg/dL (ref >39)
LDL Chol Calc (NIH): 158 mg/dL — ABNORMAL HIGH (ref 0–99)
Triglycerides: 277 mg/dL — ABNORMAL HIGH (ref 0–149)
VLDL Cholesterol Cal: 52 mg/dL — ABNORMAL HIGH (ref 5–40)

## 2023-11-25 ENCOUNTER — Ambulatory Visit: Payer: Self-pay

## 2023-11-25 ENCOUNTER — Other Ambulatory Visit: Payer: Self-pay

## 2023-11-25 DIAGNOSIS — E1169 Type 2 diabetes mellitus with other specified complication: Secondary | ICD-10-CM | POA: Insufficient documentation

## 2023-11-25 DIAGNOSIS — D509 Iron deficiency anemia, unspecified: Secondary | ICD-10-CM | POA: Insufficient documentation

## 2023-11-25 DIAGNOSIS — E559 Vitamin D deficiency, unspecified: Secondary | ICD-10-CM | POA: Insufficient documentation

## 2023-11-25 DIAGNOSIS — E1165 Type 2 diabetes mellitus with hyperglycemia: Secondary | ICD-10-CM | POA: Insufficient documentation

## 2023-11-25 MED ORDER — TRULICITY 0.75 MG/0.5ML ~~LOC~~ SOAJ: 0.7500 mg | SUBCUTANEOUS | 1 refills | Status: AC

## 2023-11-25 MED ORDER — INSULIN GLARGINE 100 UNIT/ML ~~LOC~~ SOLN: 20.0000 [IU] | Freq: Every evening | SUBCUTANEOUS | 2 refills | Status: AC

## 2023-11-25 NOTE — Assessment & Plan Note (Signed)
 Type 2 diabetes mellitus with suspected diabetic neuropathy Tingling in feet suggests neuropathy. Blood pressure controlled. A1c previously high. - Order blood work for diabetes control. - Send pen needles and metformin  prescription to [facility]. - Ensure medication refills. - Advise follow-up in 3 months.

## 2023-11-25 NOTE — Assessment & Plan Note (Signed)
 Recheck vitamin D  levels

## 2023-11-25 NOTE — Assessment & Plan Note (Signed)
 Recheck levels

## 2023-11-25 NOTE — Assessment & Plan Note (Signed)
 Recheck fasting labs.  Currently prescribed atorvastatin  80 mg.

## 2023-11-26 ENCOUNTER — Telehealth: Payer: Self-pay

## 2023-11-26 NOTE — Telephone Encounter (Signed)
 Called pt to make her aware that Her labs showed very poor control of her diabetes. I would like for her to see an endocrinologist to manage this and help to get it under better control. I will place a referral for this.

## 2023-12-10 ENCOUNTER — Other Ambulatory Visit: Payer: Self-pay | Admitting: Internal Medicine

## 2023-12-14 ENCOUNTER — Other Ambulatory Visit (HOSPITAL_COMMUNITY): Payer: Self-pay

## 2023-12-17 ENCOUNTER — Telehealth: Payer: Self-pay | Admitting: Pharmacy Technician

## 2023-12-17 ENCOUNTER — Other Ambulatory Visit (HOSPITAL_COMMUNITY): Payer: Self-pay

## 2023-12-17 NOTE — Telephone Encounter (Signed)
 Pharmacy Patient Advocate Encounter   Received notification from CoverMyMeds that prior authorization for Ozempic  (0.25 or 0.5 MG/DOSE) 2MG /3ML pen-injectors is due for renewal.   Insurance verification completed.   The patient is insured through [employer].  Medication not on pt's active med list. Achieved PA KEY# [id number].

## 2024-03-06 ENCOUNTER — Other Ambulatory Visit: Payer: Self-pay | Admitting: Internal Medicine

## 2024-03-10 ENCOUNTER — Ambulatory Visit

## 2024-03-30 ENCOUNTER — Other Ambulatory Visit: Payer: Self-pay | Admitting: Internal Medicine
